# Patient Record
Sex: Female | Born: 1995 | Race: Black or African American | Hispanic: No | Smoking: Current some day smoker
Health system: Southern US, Community
[De-identification: ages and names within clinical notes are randomized; demographics above are authoritative.]

## PROBLEM LIST (undated history)

## (undated) DIAGNOSIS — R079 Chest pain, unspecified: Secondary | ICD-10-CM

## (undated) HISTORY — PX: NO PAST SURGERIES: SHX2092

## (undated) HISTORY — DX: Chest pain, unspecified: R07.9

---

## 2015-02-01 ENCOUNTER — Encounter (HOSPITAL_COMMUNITY): Payer: Self-pay | Admitting: Emergency Medicine

## 2015-02-01 ENCOUNTER — Emergency Department (HOSPITAL_COMMUNITY)
Admission: EM | Admit: 2015-02-01 | Discharge: 2015-02-01 | Disposition: A | Discharge status: Home / Self Care | Payer: Self-pay | Attending: Physician Assistant | Admitting: Physician Assistant

## 2015-02-01 DIAGNOSIS — K625 Hemorrhage of anus and rectum: Secondary | ICD-10-CM | POA: Insufficient documentation

## 2015-02-01 DIAGNOSIS — Z720 Tobacco use: Secondary | ICD-10-CM | POA: Insufficient documentation

## 2015-02-01 DIAGNOSIS — Z3202 Encounter for pregnancy test, result negative: Secondary | ICD-10-CM | POA: Insufficient documentation

## 2015-02-01 LAB — I-STAT CHEM 8, ED
BUN: 14 mg/dL (ref 6–20)
Calcium, Ion: 1.19 mmol/L (ref 1.12–1.23)
Chloride: 106 mmol/L (ref 101–111)
Creatinine, Ser: 0.8 mg/dL (ref 0.44–1.00)
Glucose, Bld: 96 mg/dL (ref 65–99)
HCT: 44 % (ref 36.0–46.0)
Hemoglobin: 15 g/dL (ref 12.0–15.0)
Potassium: 4 mmol/L (ref 3.5–5.1)
Sodium: 140 mmol/L (ref 135–145)
TCO2: 20 mmol/L (ref 0–100)

## 2015-02-01 LAB — POC OCCULT BLOOD, ED: Fecal Occult Bld: NEGATIVE

## 2015-02-01 LAB — POC URINE PREG, ED: Preg Test, Ur: NEGATIVE

## 2015-02-01 NOTE — Discharge Instructions (Signed)
Rectal Bleeding °Rectal bleeding is when blood passes out of the anus. It is usually a sign that something is wrong. It may not be serious, but it should always be evaluated. Rectal bleeding may present as bright red blood or extremely dark stools. The color may range from dark red or maroon to black (like tar). It is important that the cause of rectal bleeding be identified so treatment can be started and the problem corrected. °CAUSES  °· Hemorrhoids. These are enlarged (dilated) blood vessels or veins in the anal or rectal area. °· Fistulas. These are abnormal, burrowing channels that usually run from inside the rectum to the skin around the anus. They can bleed. °· Anal fissures. This is a tear in the tissue of the anus. Bleeding occurs with bowel movements. °· Diverticulosis. This is a condition in which pockets or sacs project from the bowel wall. Occasionally, the sacs can bleed. °· Diverticulitis. This is an infection involving diverticulosis of the colon. °· Proctitis and colitis. These are conditions in which the rectum, colon, or both, can become inflamed and pitted (ulcerated). °· Polyps and cancer. Polyps are non-cancerous (benign) growths in the colon that may bleed. Certain types of polyps turn into cancer. °· Protrusion of the rectum. Part of the rectum can project from the anus and bleed. °· Certain medicines. °· Intestinal infections. °· Blood vessel abnormalities. °HOME CARE INSTRUCTIONS °· Eat a high-fiber diet to keep your stool soft. °· Limit activity. °· Drink enough fluids to keep your urine clear or pale yellow. °· Warm baths may be useful to soothe rectal pain. °· Follow up with your caregiver as directed. °SEEK IMMEDIATE MEDICAL CARE IF: °· You develop increased bleeding. °· You have black or dark red stools. °· You vomit blood or material that looks like coffee grounds. °· You have abdominal pain or tenderness. °· You have a fever. °· You feel weak, nauseous, or you faint. °· You have  severe rectal pain or you are unable to have a bowel movement. °MAKE SURE YOU: °· Understand these instructions. °· Will watch your condition. °· Will get help right away if you are not doing well or get worse. °Document Released: 12/06/2001 Document Revised: 09/08/2011 Document Reviewed: 12/01/2010 °[employer]® Patient Information ©2015 [employer]. This information is not intended to replace advice given to you by your health care provider. Make sure you discuss any questions you have with your health care provider. ° °

## 2015-02-01 NOTE — ED Provider Notes (Signed)
CSN: [id number]     Arrival date & time 02/01/15  5284 History   First MD Initiated Contact with Patient 02/01/15 0757     Chief Complaint  Patient presents with  . GI Bleeding     (Consider location/radiation/quality/duration/timing/severity/associated sxs/prior Treatment) HPI  This is a 19 year old female who presents emergency Department with chief complaint of rectal bleeding. Patient states that this morning she is a rest room and saw that her stool was coated with bright red blood. She denies passing any clots. She estimates about a tablespoon of blood. She denies eating any red foods, she denies any vaginal bleeding or urinary bleeding. She experienced some mild abdominal cramping, which started about 15 minutes after passing the stool. She denies a history of a inflammatory bowel disease in her family. She denies a history of hemorrhoids. She denies any recent vigorous activity or running. She denies any rectal intercourse. She has no rectal pain at this time. She denies a history of constipation or laxative use. History reviewed. No pertinent past medical history. History reviewed. No pertinent past surgical history. No family history on file. History  Substance Use Topics  . Smoking status: Current Every Day Smoker    Types: Cigars  . Smokeless tobacco: Not on file  . Alcohol Use: 0.6 oz/week    1 Cans of beer per week   OB History    No data available     Review of Systems  Ten systems reviewed and are negative for acute change, except as noted in the HPI.    Allergies  Review of patient's allergies indicates no known allergies.  Home Medications   Prior to Admission medications   Medication Sig Start Date End Date Taking? Authorizing Provider  ibuprofen (ADVIL,MOTRIN) 200 MG tablet Take 200 mg by mouth every 6 (six) hours as needed.   Yes Historical Provider, MD   BP 124/69 mmHg  Pulse 68  Temp(Src) 98.4 F (36.9 C) (Oral)  Resp 18  SpO2 100%  LMP 01/04/2015  (Approximate) Physical Exam Physical Exam  Nursing note and vitals reviewed. Constitutional: She is oriented to person, place, and time. She appears well-developed and well-nourished. No distress.  HENT:  Head: Normocephalic and atraumatic.  Eyes: Conjunctivae normal and EOM are normal. Pupils are equal, round, and reactive to light. No scleral icterus.  Neck: Normal range of motion.  Cardiovascular: Normal rate, regular rhythm and normal heart sounds.  Exam reveals no gallop and no friction rub.   No murmur heard. Pulmonary/Chest: Effort normal and breath sounds normal. No respiratory distress.  Abdominal: Soft. Bowel sounds are normal. She exhibits no distension and no mass. There is no tenderness. There is no guarding.  Neurological: She is alert and oriented to person, place, and time.  Skin: Skin is warm and dry. She is not diaphoretic.  GU: Digital Rectal Exam reveals sphincter with good tone. No external hemorrhoids. No masses or fissures. Stool color is brown with no overt blood.   ED Course  Procedures (including critical care time) Labs Review Labs Reviewed  I-STAT CHEM 8, ED  POC URINE PREG, ED  POC OCCULT BLOOD, ED    Imaging Review No results found.   EKG Interpretation None      MDM   Final diagnoses:  None    negative. Point of care stool test, no changes in hemoglobin. Negative pregnancy. Patient is to be discharged. Follow up with her primary care physician or gastroenterology. She appears safe for discharge at this time  Arthor Captain, PA-C 02/01/15 1009  Courteney Lyn Corlis Leak, MD 02/01/15 1440

## 2015-02-01 NOTE — ED Notes (Signed)
Pt reports that when she woke up this morning and went to have a bowel movement and noticed bright red blood in the toilet. Pt alert x4. NAD at this time.

## 2015-09-08 ENCOUNTER — Encounter (HOSPITAL_COMMUNITY): Payer: Self-pay

## 2015-09-08 ENCOUNTER — Emergency Department (HOSPITAL_COMMUNITY)
Admission: EM | Admit: 2015-09-08 | Discharge: 2015-09-08 | Disposition: A | Discharge status: Home / Self Care | Payer: Self-pay | Attending: Emergency Medicine | Admitting: Emergency Medicine

## 2015-09-08 ENCOUNTER — Emergency Department (HOSPITAL_COMMUNITY): Payer: Self-pay

## 2015-09-08 DIAGNOSIS — R059 Cough, unspecified: Secondary | ICD-10-CM

## 2015-09-08 DIAGNOSIS — J040 Acute laryngitis: Secondary | ICD-10-CM | POA: Insufficient documentation

## 2015-09-08 DIAGNOSIS — R05 Cough: Secondary | ICD-10-CM

## 2015-09-08 DIAGNOSIS — R0602 Shortness of breath: Secondary | ICD-10-CM | POA: Insufficient documentation

## 2015-09-08 DIAGNOSIS — F17210 Nicotine dependence, cigarettes, uncomplicated: Secondary | ICD-10-CM | POA: Insufficient documentation

## 2015-09-08 MED ORDER — ALBUTEROL SULFATE HFA 108 (90 BASE) MCG/ACT IN AERS: 1.0000 | INHALATION_SPRAY | RESPIRATORY_TRACT | Status: DC | PRN

## 2015-09-08 MED ORDER — GUAIFENESIN-CODEINE 100-10 MG/5ML PO SOLN: 10.0000 mL | Freq: Three times a day (TID) | ORAL | Status: DC | PRN

## 2015-09-08 MED ORDER — GUAIFENESIN-CODEINE 100-10 MG/5ML PO SOLN
10.0000 mL | Freq: Once | ORAL | Status: AC
Start: 2015-09-08 — End: 2015-09-08
  Administered 2015-09-08: 10 mL via ORAL
  Filled 2015-09-08: qty 10

## 2015-09-08 NOTE — ED Provider Notes (Signed)
CSN: [id number]     Arrival date & time 09/08/15  0543 History   First MD Initiated Contact with Patient 09/08/15 0703     Chief Complaint  Patient presents with  . Hoarse  . Cough   (Consider location/radiation/quality/duration/timing/severity/associated sxs/prior Treatment) Patient is a 20 y.o. female presenting with cough. The history is provided by the patient. No language interpreter was used.  Cough Associated symptoms: shortness of breath   Associated symptoms: no chest pain, no chills and no fever     Karen Benitez is a 20 year old female with no significant past medical history who presents with blood streaked white sputum cough 1 week with hoarseness. She states that she was sick with flulike symptoms 1 week ago but all her symptoms resolved except for the cough. She has taken NyQuil, Chloraseptic throat spray, and drank hot tea to help relieve her symptoms. She states she feels short of breath at times.  She denies any fever, or throat, headache, rhinorrhea, chest pain, abdominal pain, nausea, vomiting.  History reviewed. No pertinent past medical history. History reviewed. No pertinent past surgical history. History reviewed. No pertinent family history. Social History  Substance Use Topics  . Smoking status: Current Every Day Smoker    Types: Cigars  . Smokeless tobacco: None  . Alcohol Use: 0.6 oz/week    1 Cans of beer per week   OB History    No data available     Review of Systems  Constitutional: Negative for fever and chills.  Respiratory: Positive for cough and shortness of breath.   Cardiovascular: Negative for chest pain.  All other systems reviewed and are negative.     Allergies  Review of patient's allergies indicates no known allergies.  Home Medications   Prior to Admission medications   Medication Sig Start Date End Date Taking? Authorizing Provider  albuterol (PROVENTIL HFA;VENTOLIN HFA) 108 (90 Base) MCG/ACT inhaler Inhale 1-2 puffs into  the lungs every 4 (four) hours as needed for wheezing or shortness of breath. 09/08/15   Pasco Marchitto Patel-Mills, PA-C  guaiFENesin-codeine 100-10 MG/5ML syrup Take 10 mLs by mouth 3 (three) times daily as needed for cough. 09/08/15   Colby Catanese Patel-Mills, PA-C   BP 122/77 mmHg  Pulse 56  Temp(Src) 98.8 F (37.1 C) (Oral)  Resp 18  Ht 5\' 4"  (1.626 m)  Wt 81.647 kg  BMI 30.88 kg/m2  SpO2 100%  LMP 09/08/2015 Physical Exam  Constitutional: She is oriented to person, place, and time. She appears well-developed and well-nourished. No distress.  HENT:  Head: Normocephalic and atraumatic.  Mouth/Throat: Oropharynx is clear and moist. No oropharyngeal exudate.  Oropharynx is clear and moist. Uvula midline. No drooling or trismus.  Eyes: Conjunctivae are normal.  Neck: Normal range of motion. Neck supple.  Cardiovascular: Normal rate, regular rhythm and normal heart sounds.   Pulmonary/Chest: Effort normal and breath sounds normal. No respiratory distress. She has no wheezes. She has no rales.  Lungs clear to auscultation bilaterally. No wheezing or decreased breath sounds. Sitting comfortably in no acute distress.  Hoarse voice.  Abdominal: Soft. There is no tenderness.  Musculoskeletal: Normal range of motion.  Neurological: She is alert and oriented to person, place, and time.  Skin: Skin is warm. She is not diaphoretic.  Nursing note and vitals reviewed.   ED Course  Procedures (including critical care time) Labs Review Labs Reviewed - No data to display  Imaging Review Dg Chest 2 View  09/08/2015  CLINICAL DATA:  Cough and  fever EXAM: CHEST  2 VIEW COMPARISON:  None. FINDINGS: Normal heart size and mediastinal contours. No acute infiltrate or edema. No effusion or pneumothorax. No acute osseous findings. IMPRESSION: No active cardiopulmonary disease. Electronically Signed   By: Marnee Spring M.D.   On: 09/08/2015 06:52   I have personally reviewed and evaluated these image results as  part of my medical decision-making.   EKG Interpretation None      MDM   Final diagnoses:  Cough  Laryngitis   Patient presents for productive blood streaked, white sputum cough 1 week. This is residual from flulike symptoms 1 week ago. Chest x-ray is negative for infiltrate or edema. No pneumothorax. Patient is hoarse but otherwise her exam is normal. I discussed return precautions with the patient as well as follow-up. Patient agrees with plan. Filed Vitals:   09/08/15 0551 09/08/15 0805  BP: 132/75 122/77  Pulse: 65 56  Temp: 98.4 F (36.9 C) 98.8 F (37.1 C)  Resp: 20 18   Medications  guaiFENesin-codeine 100-10 MG/5ML solution 10 mL (10 mLs Oral Given 09/08/15 0812)      Catha Gosselin, PA-C 09/08/15 1524  Marily Memos, MD 09/09/15 1610

## 2015-09-08 NOTE — Discharge Instructions (Signed)

## 2015-09-08 NOTE — ED Notes (Signed)
Pt here for cough all week, hoarseness and sts today had episode of blood tinged sputum.

## 2015-09-08 NOTE — ED Notes (Signed)
Patient transported to X-ray 

## 2016-10-25 ENCOUNTER — Emergency Department (HOSPITAL_COMMUNITY): Payer: Self-pay

## 2016-10-25 ENCOUNTER — Encounter (HOSPITAL_COMMUNITY): Payer: Self-pay

## 2016-10-25 ENCOUNTER — Emergency Department (HOSPITAL_COMMUNITY)
Admission: EM | Admit: 2016-10-25 | Discharge: 2016-10-26 | Disposition: A | Discharge status: Home / Self Care | Payer: Self-pay | Attending: Emergency Medicine | Admitting: Emergency Medicine

## 2016-10-25 DIAGNOSIS — R079 Chest pain, unspecified: Secondary | ICD-10-CM | POA: Insufficient documentation

## 2016-10-25 DIAGNOSIS — F17290 Nicotine dependence, other tobacco product, uncomplicated: Secondary | ICD-10-CM | POA: Insufficient documentation

## 2016-10-25 LAB — CBC
HCT: 36.7 % (ref 36.0–46.0)
Hemoglobin: 12.5 g/dL (ref 12.0–15.0)
MCH: 28.9 pg (ref 26.0–34.0)
MCHC: 34.1 g/dL (ref 30.0–36.0)
MCV: 84.8 fL (ref 78.0–100.0)
PLATELETS: 318 10*3/uL (ref 150–400)
RBC: 4.33 MIL/uL (ref 3.87–5.11)
RDW: 14 % (ref 11.5–15.5)
WBC: 8.6 10*3/uL (ref 4.0–10.5)

## 2016-10-25 LAB — BASIC METABOLIC PANEL
Anion gap: 8 (ref 5–15)
BUN: 10 mg/dL (ref 6–20)
CHLORIDE: 104 mmol/L (ref 101–111)
CO2: 24 mmol/L (ref 22–32)
CREATININE: 0.9 mg/dL (ref 0.44–1.00)
Calcium: 9.4 mg/dL (ref 8.9–10.3)
GFR calc Af Amer: >60 mL/min (ref >60)
GFR calc non Af Amer: >60 mL/min (ref >60)
Glucose, Bld: 97 mg/dL (ref 65–99)
Potassium: 3.6 mmol/L (ref 3.5–5.1)
SODIUM: 136 mmol/L (ref 135–145)

## 2016-10-25 LAB — I-STAT BETA HCG BLOOD, ED (MC, WL, AP ONLY): I-stat hCG, quantitative: <5 m[IU]/mL (ref <5)

## 2016-10-25 LAB — D-DIMER, QUANTITATIVE: D-Dimer, Quant: 0.5 ug/mL-FEU (ref 0.00–0.50)

## 2016-10-25 LAB — TROPONIN I

## 2016-10-25 MED ORDER — HYDROCODONE-ACETAMINOPHEN 5-325 MG PO TABS
1.0000 | ORAL_TABLET | Freq: Once | ORAL | Status: AC
  Administered 2016-10-25: 1 via ORAL
  Filled 2016-10-25: qty 1

## 2016-10-25 NOTE — ED Notes (Signed)
Patient transported to X-ray 

## 2016-10-25 NOTE — ED Triage Notes (Signed)
Pt states she had sudden onset of right side sharp chest pain with SOB allday; pt states she has taken antiacids with no relief;t pt a&ox 4 on arrival; no obvious signs of distress noted

## 2016-10-25 NOTE — ED Provider Notes (Signed)
[facility]-EMERGENCY DEPT Provider Note   CSN: [id number] Arrival date & time: 10/25/16  2142     History   Chief Complaint Chief Complaint  Patient presents with  . Chest Pain  . Shortness of Breath    HPI Karen Benitez is a 21 y.o. female.  HPI   Patient presents with sharp right sided chest pain that radiates through to the back, worse with deep inspiration and movement, swallowing, and breathing, has been constant all day, not improved with ibuprofen.  Notes she has had similar pain about every 6 months, usually only lasts a few hours.  She smoked black and milds.  Denies fevers, chills, myalgias, night sweats, recent illness, cough, hemoptysis, sick contacts.  She is on nexplanon.  No recent immobilization or leg swelling.  Denies associated rash.    History reviewed. No pertinent past medical history.  There are no active problems to display for this patient.   History reviewed. No pertinent surgical history.  OB History    No data available       Home Medications    Prior to Admission medications   Medication Sig Start Date End Date Taking? Authorizing Provider  albuterol (PROVENTIL HFA;VENTOLIN HFA) 108 (90 Base) MCG/ACT inhaler Inhale 1-2 puffs into the lungs every 4 (four) hours as needed for wheezing or shortness of breath. 09/08/15   Hanna Patel-Mills, PA-C  guaiFENesin-codeine 100-10 MG/5ML syrup Take 10 mLs by mouth 3 (three) times daily as needed for cough. 09/08/15   Catha Gosselin, PA-C    Family History No family history on file.  Social History Social History  Substance Use Topics  . Smoking status: Current Every Day Smoker    Types: Cigars  . Smokeless tobacco: Not on file  . Alcohol use 0.6 oz/week    1 Cans of beer per week     Allergies   Patient has no known allergies.   Review of Systems Review of Systems  All other systems reviewed and are negative.    Physical Exam Updated Vital Signs BP 125/74   Pulse 70   Temp 98.7 F  (37.1 C) (Oral)   Resp 20   LMP 09/30/2016   SpO2 100%   Physical Exam  Constitutional: She appears well-developed and well-nourished. No distress.  HENT:  Head: Normocephalic and atraumatic.  Neck: Neck supple.  Cardiovascular: Normal rate, regular rhythm and intact distal pulses.   Pulmonary/Chest: Effort normal and breath sounds normal. No respiratory distress. She has no wheezes. She has no rales.  Abdominal: Soft. She exhibits no distension. There is no tenderness. There is no rebound and no guarding.  Musculoskeletal: She exhibits no edema.  Neurological: She is alert.  Skin: She is not diaphoretic.  Nursing note and vitals reviewed.    ED Treatments / Results  Labs (all labs ordered are listed, but only abnormal results are displayed) Labs Reviewed  BASIC METABOLIC PANEL  CBC  TROPONIN I  D-DIMER, QUANTITATIVE (NOT AT [facility])  I-STAT BETA HCG BLOOD, ED ([facility], [facility], [facility] ONLY)    EKG  EKG Interpretation  Date/Time:  Saturday October 25 2016 21:48:44 EDT Ventricular Rate:  82 PR Interval:  138 QRS Duration: 78 QT Interval:  368 QTC Calculation: 429 R Axis:   79 Text Interpretation:  Normal sinus rhythm with sinus arrhythmia Nonspecific ST and T wave abnormality No previous tracing Confirmed by Anitra Lauth  MD, Alphonzo Lemmings ([id number]) on 10/25/2016 11:55:12 PM       Radiology Dg Chest 2 View  Result  Date: 10/25/2016 CLINICAL DATA:  Chest pain and shortness of breath EXAM: CHEST  2 VIEW COMPARISON:  September 08, 2015 FINDINGS: The heart size and mediastinal contours are within normal limits. Both lungs are clear. The visualized skeletal structures are unremarkable. IMPRESSION: No active cardiopulmonary disease. Electronically Signed   By: Gerome Sam III M.D   On: 10/25/2016 22:38   Ct Angio Chest Pe W/cm &/or Wo Cm  Result Date: 10/26/2016 CLINICAL DATA:  Chest pain x6 months intermittently, worsening this evening with pleuritic chest pain on the right. EXAM: CT ANGIOGRAPHY CHEST  WITH CONTRAST TECHNIQUE: Multidetector CT imaging of the chest was performed using the standard protocol during bolus administration of intravenous contrast. Multiplanar CT image reconstructions and MIPs were obtained to evaluate the vascular anatomy. CONTRAST:  100 cc Isovue 370 IV COMPARISON:  None. FINDINGS: Cardiovascular: The study is of quality for the evaluation of pulmonary embolism. There are no filling defects in the central, lobar, segmental or subsegmental pulmonary artery branches to suggest acute pulmonary embolism. Great vessels are normal in course and caliber. Normal heart size. No significant pericardial fluid/thickening. Mediastinum/Nodes: No discrete thyroid nodules. Unremarkable esophagus. No pathologically enlarged axillary, mediastinal or hilar lymph nodes. Lungs/Pleura: No pneumothorax. No pleural effusion. Minimal bibasilar dependent atelectasis. Upper abdomen: Unremarkable. Musculoskeletal:  No aggressive appearing focal osseous lesions. Review of the MIP images confirms the above findings. IMPRESSION: Negative for pulmonary embolus. Minimal bibasilar dependent atelectasis. No acute pulmonary disease. Electronically Signed   By: Tollie Eth M.D.   On: 10/26/2016 01:47    Procedures Procedures (including critical care time)  Medications Ordered in ED Medications  HYDROcodone-acetaminophen (NORCO/VICODIN) 5-325 MG per tablet 1 tablet (1 tablet Oral Given 10/25/16 2348)  iopamidol (ISOVUE-370) 76 % injection (100 mLs  Contrast Given 10/26/16 0052)     Initial Impression / Assessment and Plan / ED Course  I have reviewed the triage vital signs and the nursing notes.  Pertinent labs & imaging results that were available during my care of the patient were reviewed by me and considered in my medical decision making (see chart for details).     Afebrile, nontoxic patient with right sided sharp chest pain with pleuritic component.  Workup reassuring.  D-dimer was borderline and pt  had concerning clinical scenario - engaged in joint medical decision making with the patient and CT angio chest was ordered; this was also negative.   D/C home with PCP follow up.  Discussed result, findings, treatment, and follow up  with patient.  Pt given return precautions.  Pt verbalizes understanding and agrees with plan.       Final Clinical Impressions(s) / ED Diagnoses   Final diagnoses:  Right-sided chest pain    New Prescriptions Discharge Medication List as of 10/26/2016  1:54 AM       Trixie Dredge, PA-C 10/26/16 1610    Gwyneth Sprout, MD 10/26/16 2240

## 2016-10-25 NOTE — ED Notes (Signed)
ED Provider at bedside. 

## 2016-10-26 ENCOUNTER — Emergency Department (HOSPITAL_COMMUNITY): Payer: Self-pay

## 2016-10-26 ENCOUNTER — Encounter (HOSPITAL_COMMUNITY): Payer: Self-pay | Admitting: Radiology

## 2016-10-26 MED ORDER — IOPAMIDOL (ISOVUE-370) INJECTION 76%
INTRAVENOUS | Status: AC
  Administered 2016-10-26: 100 mL
  Filled 2016-10-26: qty 100

## 2016-10-26 NOTE — Discharge Instructions (Signed)
Read the information below.  You may return to the Emergency Department at any time for worsening condition or any new symptoms that concern you.   If you develop worsening chest pain, shortness of breath, fever, you pass out, or become weak or dizzy, return to the ER for a recheck.    °

## 2016-10-26 NOTE — ED Notes (Signed)
Patient transported to CT 

## 2017-02-04 ENCOUNTER — Other Ambulatory Visit (HOSPITAL_COMMUNITY)
Admission: RE | Admit: 2017-02-04 | Discharge: 2017-02-04 | Disposition: A | Discharge status: Home / Self Care | Payer: 59 | Source: Ambulatory Visit | Attending: Obstetrics and Gynecology | Admitting: Obstetrics and Gynecology

## 2017-02-04 ENCOUNTER — Other Ambulatory Visit: Payer: Self-pay | Admitting: Obstetrics and Gynecology

## 2017-02-04 DIAGNOSIS — Z01419 Encounter for gynecological examination (general) (routine) without abnormal findings: Secondary | ICD-10-CM | POA: Diagnosis present

## 2017-02-05 LAB — CYTOLOGY - PAP: Diagnosis: NEGATIVE

## 2017-11-09 ENCOUNTER — Encounter (HOSPITAL_COMMUNITY): Payer: Self-pay | Admitting: Emergency Medicine

## 2017-11-09 ENCOUNTER — Emergency Department (HOSPITAL_COMMUNITY): Payer: Self-pay

## 2017-11-09 ENCOUNTER — Emergency Department (HOSPITAL_COMMUNITY)
Admission: EM | Admit: 2017-11-09 | Discharge: 2017-11-09 | Disposition: A | Discharge status: Home / Self Care | Payer: Self-pay | Attending: Emergency Medicine | Admitting: Emergency Medicine

## 2017-11-09 DIAGNOSIS — R0789 Other chest pain: Secondary | ICD-10-CM | POA: Insufficient documentation

## 2017-11-09 DIAGNOSIS — M940 Chondrocostal junction syndrome [Tietze]: Secondary | ICD-10-CM | POA: Insufficient documentation

## 2017-11-09 LAB — BASIC METABOLIC PANEL
Anion gap: 9 (ref 5–15)
BUN: 13 mg/dL (ref 6–20)
CO2: 24 mmol/L (ref 22–32)
Calcium: 9.3 mg/dL (ref 8.9–10.3)
Chloride: 107 mmol/L (ref 101–111)
Creatinine, Ser: 0.85 mg/dL (ref 0.44–1.00)
GFR calc Af Amer: >60 mL/min (ref >60)
GFR calc non Af Amer: >60 mL/min (ref >60)
GLUCOSE: 89 mg/dL (ref 65–99)
POTASSIUM: 4.1 mmol/L (ref 3.5–5.1)
Sodium: 140 mmol/L (ref 135–145)

## 2017-11-09 LAB — I-STAT TROPONIN, ED
Troponin i, poc: 0 ng/mL (ref 0.00–0.08)
Troponin i, poc: 0 ng/mL (ref 0.00–0.08)

## 2017-11-09 LAB — I-STAT BETA HCG BLOOD, ED (MC, WL, AP ONLY): I-stat hCG, quantitative: <5 m[IU]/mL (ref <5)

## 2017-11-09 LAB — CBC
HEMATOCRIT: 40.1 % (ref 36.0–46.0)
Hemoglobin: 13.8 g/dL (ref 12.0–15.0)
MCH: 29.6 pg (ref 26.0–34.0)
MCHC: 34.4 g/dL (ref 30.0–36.0)
MCV: 85.9 fL (ref 78.0–100.0)
Platelets: 317 10*3/uL (ref 150–400)
RBC: 4.67 MIL/uL (ref 3.87–5.11)
RDW: 13.5 % (ref 11.5–15.5)
WBC: 5.6 10*3/uL (ref 4.0–10.5)

## 2017-11-09 LAB — D-DIMER, QUANTITATIVE (NOT AT ARMC): D-Dimer, Quant: 0.38 ug/mL-FEU (ref 0.00–0.50)

## 2017-11-09 MED ORDER — PREDNISONE 50 MG PO TABS: 50.0000 mg | ORAL_TABLET | Freq: Every day | ORAL | 0 refills | Status: DC

## 2017-11-09 MED ORDER — TRAMADOL HCL 50 MG PO TABS: 50.0000 mg | ORAL_TABLET | Freq: Four times a day (QID) | ORAL | 0 refills | Status: DC | PRN

## 2017-11-09 MED ORDER — KETOROLAC TROMETHAMINE 30 MG/ML IJ SOLN: 30.0000 mg | Freq: Once | INTRAMUSCULAR | Status: DC

## 2017-11-09 MED ORDER — KETOROLAC TROMETHAMINE 60 MG/2ML IM SOLN: 60.0000 mg | Freq: Once | INTRAMUSCULAR | Status: DC

## 2017-11-09 NOTE — ED Notes (Signed)
Spoke with Harrold Donath from the lab. Will add D-dimer to an existing tube at the lab.

## 2017-11-09 NOTE — ED Triage Notes (Signed)
Pt reports for past month having intermittent central chest pains and some today on right side. Pains come on when bent over and moving around is when pains come on. denies n/v/d. But has been burping a lot, even if hasnt eaten.

## 2017-11-09 NOTE — ED Notes (Signed)
3 hour troponin order placed per verbal. 

## 2017-11-09 NOTE — ED Notes (Signed)
ED Provider at bedside. 

## 2017-11-09 NOTE — ED Provider Notes (Signed)
[facility]-EMERGENCY DEPT Provider Note   CSN: [id number] Arrival date & time: 11/09/17  1223     History   Chief Complaint Chief Complaint  Patient presents with  . Chest Pain    HPI Karen Benitez is a 22 y.o. female.  HPI Patient presents to the emergency department with 2 months worth of chest discomfort.  Patient states is not constant and seems to come and go.  She states certain movements will sometimes make it worse.  The patient states that it feels like sharp pain that does sometimes radiate to the right shoulder.  Patient states that she has not had any other symptoms associated with this.  The patient denies  shortness of breath, headache,blurred vision, neck pain, fever, cough, weakness, numbness, dizziness, anorexia, edema, abdominal pain, nausea, vomiting, diarrhea, rash, back pain, dysuria, hematemesis, bloody stool, near syncope, or syncope. History reviewed. No pertinent past medical history.  There are no active problems to display for this patient.   History reviewed. No pertinent surgical history.   OB History   None      Home Medications    Prior to Admission medications   Medication Sig Start Date End Date Taking? Authorizing Provider  albuterol (PROVENTIL HFA;VENTOLIN HFA) 108 (90 Base) MCG/ACT inhaler Inhale 1-2 puffs into the lungs every 4 (four) hours as needed for wheezing or shortness of breath. Patient not taking: Reported on 11/09/2017 09/08/15   Patel-Mills, Lorelle Formosa, PA-C  guaiFENesin-codeine 100-10 MG/5ML syrup Take 10 mLs by mouth 3 (three) times daily as needed for cough. Patient not taking: Reported on 11/09/2017 09/08/15   Catha Gosselin, PA-C    Family History No family history on file.  Social History Social History   Tobacco Use  . Smoking status: Former Smoker    Types: Cigars  . Smokeless tobacco: Never Used  Substance Use Topics  . Alcohol use: Yes    Alcohol/week: 0.6 oz    Types: 1 Cans of beer per  week  . Drug use: No     Allergies   Patient has no known allergies.   Review of Systems Review of Systems  All other systems negative except as documented in the HPI. All pertinent positives and negatives as reviewed in the HPI.  Physical Exam Updated Vital Signs BP (!) 116/92 (BP Location: Left Arm)   Pulse 88   Temp 98.9 F (37.2 C) (Oral)   Resp 18   Ht  (1.651 m)   Wt 81.6 kg (180 lb)   SpO2 100%   BMI 29.95 kg/m   Physical Exam  Constitutional: She is oriented to person, place, and time. She appears well-developed and well-nourished. No distress.  HENT:  Head: Normocephalic and atraumatic.  Mouth/Throat: Oropharynx is clear and moist.  Eyes: Pupils are equal, round, and reactive to light.  Neck: Normal range of motion. Neck supple.  Cardiovascular: Normal rate, regular rhythm and normal heart sounds. Exam reveals no gallop and no friction rub.  No murmur heard. Pulmonary/Chest: Effort normal and breath sounds normal. No respiratory distress. She has no decreased breath sounds. She has no wheezes. She has no rhonchi. She has no rales.  Abdominal: Soft. Bowel sounds are normal. She exhibits no distension. There is no tenderness.  Neurological: She is alert and oriented to person, place, and time. She exhibits normal muscle tone. Coordination normal.  Skin: Skin is warm and dry. Capillary refill takes less than 2 seconds. No rash noted. No erythema.  Psychiatric: She has  a normal mood and affect. Her behavior is normal.  Nursing note and vitals reviewed.    ED Treatments / Results  Labs (all labs ordered are listed, but only abnormal results are displayed) Labs Reviewed  BASIC METABOLIC PANEL  CBC  D-DIMER, QUANTITATIVE (NOT AT [facility])  I-STAT TROPONIN, ED  I-STAT BETA HCG BLOOD, ED ([facility], [facility], [facility] ONLY)  I-STAT TROPONIN, ED    EKG None  Radiology Dg Chest 2 View  Result Date: 11/09/2017 CLINICAL DATA:  Intermittent mid chest discomfort for the past  month with right-sided chest pain today. Symptoms are precipitated by bending over and moving. Increased burping. No other GI symptoms. EXAM: CHEST - 2 VIEW COMPARISON:  PA and lateral chest x-ray of October 25, 2016 and chest CT scan of October 26, 2016 FINDINGS: The lungs are adequately inflated and clear. The heart and pulmonary vascularity are normal. The mediastinum is normal in width. There is no pleural effusion, pneumothorax, or pneumomediastinum. The bony thorax is unremarkable. IMPRESSION: There is no active cardiopulmonary disease. Electronically Signed   By: David  [place] M.D.   On: 11/09/2017 13:19    Procedures Procedures (including critical care time)  Medications Ordered in ED Medications  ketorolac (TORADOL) 30 MG/ML injection 30 mg (has no administration in time range)     Initial Impression / Assessment and Plan / ED Course  I have reviewed the triage vital signs and the nursing notes.  Pertinent labs & imaging results that were available during my care of the patient were reviewed by me and considered in my medical decision making (see chart for details).     Patient will need further evaluation in the primary care setting.  At this point I do not see any significant findings on her imaging or laboratory testing.  Patient has a negative d-dimer.  I advised the patient to return here for any worsening in her condition.  Patient agrees the plan and all questions were answered.  The patient's atypical chest pain most likely is chest wall versus costochondritis.   Final Clinical Impressions(s) / ED Diagnoses   Final diagnoses:  None    ED Discharge Orders    None       Charlestine Night, PA-C 11/10/17 0024    Charlynne Pander, MD 11/11/17 [phone]

## 2017-11-09 NOTE — Discharge Instructions (Signed)
Return here as needed.  Follow-up with the clinic provided.  Your x-rays do not show any abnormalities in your blood work for your heart and for possible blood clot are normal.

## 2018-12-10 ENCOUNTER — Emergency Department (HOSPITAL_COMMUNITY)
Admission: EM | Admit: 2018-12-10 | Discharge: 2018-12-10 | Disposition: A | Discharge status: Home / Self Care | Payer: 59 | Attending: Emergency Medicine | Admitting: Emergency Medicine

## 2018-12-10 ENCOUNTER — Other Ambulatory Visit: Payer: Self-pay

## 2018-12-10 ENCOUNTER — Encounter (HOSPITAL_COMMUNITY): Payer: Self-pay

## 2018-12-10 DIAGNOSIS — Z23 Encounter for immunization: Secondary | ICD-10-CM | POA: Diagnosis not present

## 2018-12-10 DIAGNOSIS — W2203XA Walked into furniture, initial encounter: Secondary | ICD-10-CM | POA: Insufficient documentation

## 2018-12-10 DIAGNOSIS — Y999 Unspecified external cause status: Secondary | ICD-10-CM | POA: Diagnosis not present

## 2018-12-10 DIAGNOSIS — Z79899 Other long term (current) drug therapy: Secondary | ICD-10-CM | POA: Diagnosis not present

## 2018-12-10 DIAGNOSIS — S81812A Laceration without foreign body, left lower leg, initial encounter: Secondary | ICD-10-CM | POA: Diagnosis present

## 2018-12-10 DIAGNOSIS — Y929 Unspecified place or not applicable: Secondary | ICD-10-CM | POA: Insufficient documentation

## 2018-12-10 DIAGNOSIS — Y939 Activity, unspecified: Secondary | ICD-10-CM | POA: Diagnosis not present

## 2018-12-10 DIAGNOSIS — Z87891 Personal history of nicotine dependence: Secondary | ICD-10-CM | POA: Diagnosis not present

## 2018-12-10 MED ORDER — TETANUS-DIPHTH-ACELL PERTUSSIS 5-2.5-18.5 LF-MCG/0.5 IM SUSP
0.5000 mL | Freq: Once | INTRAMUSCULAR | Status: AC
  Administered 2018-12-10: 0.5 mL via INTRAMUSCULAR
  Filled 2018-12-10: qty 0.5

## 2018-12-10 MED ORDER — LIDOCAINE-EPINEPHRINE (PF) 2 %-1:200000 IJ SOLN
10.0000 mL | Freq: Once | INTRAMUSCULAR | Status: AC
  Administered 2018-12-10: 10 mL
  Filled 2018-12-10: qty 10

## 2018-12-10 NOTE — ED Provider Notes (Signed)
[facility] DEPT Provider Note   CSN: [id number] Arrival date & time: 12/10/18  2015     History   Chief Complaint Chief Complaint  Patient presents with  . Laceration    HPI Karen Benitez is a 23 y.o. female.  HPI   23 year old female with laceration to her left shin.  She cut it on the edge of a coffee table earlier today.  Denies any other injuries.  No numbness or tingling.  She is unsure of her last tetanus shot.  History reviewed. No pertinent past medical history.  There are no active problems to display for this patient.   History reviewed. No pertinent surgical history.   OB History   No obstetric history on file.      Home Medications    Prior to Admission medications   Medication Sig Start Date End Date Taking? Authorizing Provider  albuterol (PROVENTIL HFA;VENTOLIN HFA) 108 (90 Base) MCG/ACT inhaler Inhale 1-2 puffs into the lungs every 4 (four) hours as needed for wheezing or shortness of breath. Patient not taking: Reported on 11/09/2017 09/08/15   Patel-Mills, Orvil Feil, PA-C  guaiFENesin-codeine 100-10 MG/5ML syrup Take 10 mLs by mouth 3 (three) times daily as needed for cough. Patient not taking: Reported on 11/09/2017 09/08/15   Patel-Mills, Orvil Feil, PA-C  predniSONE (DELTASONE) 50 MG tablet Take 1 tablet (50 mg total) by mouth daily. 11/09/17   Lawyer, Harrell Gave, PA-C  traMADol (ULTRAM) 50 MG tablet Take 1 tablet (50 mg total) by mouth every 6 (six) hours as needed for severe pain. 11/09/17   Dalia Heading, PA-C    Family History History reviewed. No pertinent family history.  Social History Social History   Tobacco Use  . Smoking status: Former Smoker    Types: Cigars  . Smokeless tobacco: Never Used  Substance Use Topics  . Alcohol use: Yes    Alcohol/week: 1.0 standard drinks    Types: 1 Cans of beer per week  . Drug use: No     Allergies   Patient has no known allergies.   Review of Systems Review of  Systems  All systems reviewed and negative, other than as noted in HPI.  Physical Exam Updated Vital Signs BP (!) 157/95 (BP Location: Left Arm)   Pulse 87   Temp 98.2 F (36.8 C) (Oral)   Resp 16   SpO2 100%   Physical Exam Vitals signs and nursing note reviewed.  Constitutional:      General: She is not in acute distress.    Appearance: She is well-developed.  HENT:     Head: Normocephalic and atraumatic.  Eyes:     General:        Right eye: No discharge.        Left eye: No discharge.     Conjunctiva/sclera: Conjunctivae normal.  Neck:     Musculoskeletal: Neck supple.  Cardiovascular:     Rate and Rhythm: Normal rate and regular rhythm.     Heart sounds: Normal heart sounds. No murmur. No friction rub. No gallop.   Pulmonary:     Effort: Pulmonary effort is normal. No respiratory distress.     Breath sounds: Normal breath sounds.  Abdominal:     General: There is no distension.     Palpations: Abdomen is soft.     Tenderness: There is no abdominal tenderness.  Musculoskeletal:     Comments: R proximal shin with approximately 4 cm linear laceration.  No active bleeding.  Skin:  General: Skin is warm and dry.  Neurological:     Mental Status: She is alert.  Psychiatric:        Behavior: Behavior normal.        Thought Content: Thought content normal.      ED Treatments / Results  Labs (all labs ordered are listed, but only abnormal results are displayed) Labs Reviewed - No data to display  EKG    Radiology No results found.  Procedures Procedures (including critical care time)  Medications Ordered in ED Medications  lidocaine-EPINEPHrine (XYLOCAINE W/EPI) 2 %-1:200000 (PF) injection 10 mL (has no administration in time range)  Tdap (BOOSTRIX) injection 0.5 mL (has no administration in time range)     Initial Impression / Assessment and Plan / ED Course  I have reviewed the triage vital signs and the nursing notes.  Pertinent labs &  imaging results that were available during my care of the patient were reviewed by me and considered in my medical decision making (see chart for details).        [age] year old female with left shin laceration.  Neurovascularly intact.  Will close.  Update tetanus.  Final Clinical Impressions(s) / ED Diagnoses   Final diagnoses:  Laceration of right lower extremity, initial encounter    ED Discharge Orders    None       Raeford RazorKohut, Rich Paprocki, MD 12/11/18 1504

## 2018-12-10 NOTE — ED Triage Notes (Signed)
Pt reports cutting her L shin on her coffee table today. Deep laceration noted. Bleeding controlled with dressing. A&Ox4. Unsure of last tetanus shot.

## 2018-12-10 NOTE — ED Provider Notes (Signed)
  Physical Exam  BP 128/89   Pulse 78   Temp 98.2 F (36.8 C) (Oral)   Resp 16   SpO2 99%   Physical Exam Vitals signs and nursing note reviewed.  Constitutional:      General: She is not in acute distress.    Appearance: Normal appearance. She is well-developed. She is not ill-appearing.  HENT:     Head: Normocephalic and atraumatic.  Eyes:     General: No scleral icterus.       Right eye: No discharge.        Left eye: No discharge.     Conjunctiva/sclera: Conjunctivae normal.     Pupils: Pupils are equal, round, and reactive to light.  Neck:     Musculoskeletal: Normal range of motion.  Cardiovascular:     Rate and Rhythm: Normal rate.  Pulmonary:     Effort: Pulmonary effort is normal. No respiratory distress.  Abdominal:     General: There is no distension.  Skin:    General: Skin is warm and dry.     Comments: ~4 cm linear laceration over the right anterior shin  Neurological:     Mental Status: She is alert and oriented to person, place, and time.  Psychiatric:        Behavior: Behavior normal.     ED Course/Procedures     .Marland KitchenLaceration Repair  Date/Time: 12/10/2018 10:46 PM Performed by: Recardo Evangelist, PA-C Authorized by: Recardo Evangelist, PA-C   Consent:    Consent obtained:  Verbal   Consent given by:  Patient   Risks discussed:  Infection and pain   Alternatives discussed:  No treatment Anesthesia (see MAR for exact dosages):    Anesthesia method:  Local infiltration   Local anesthetic:  Lidocaine 2% WITH epi Laceration details:    Location:  Leg   Leg location:  R lower leg   Length (cm):  4   Depth (mm):  10 Repair type:    Repair type:  Simple Pre-procedure details:    Preparation:  Patient was prepped and draped in usual sterile fashion Exploration:    Wound exploration: wound explored through full range of motion and entire depth of wound probed and visualized     Wound extent: no fascia violation noted, no muscle damage noted,  no tendon damage noted and no vascular damage noted   Treatment:    Area cleansed with:  Saline   Amount of cleaning:  Standard   Irrigation method:  Syringe   Visualized foreign bodies/material removed: no   Skin repair:    Repair method:  Sutures   Suture size:  3-0   Suture material:  Prolene   Number of sutures:  7 Approximation:    Approximation:  Close Post-procedure details:    Dressing:  Antibiotic ointment and sterile dressing   Patient tolerance of procedure:  Tolerated well, no immediate complications    MDM   23 year old female with linear laceration over the right anterior shin after cutting herself on a glass table. Wound was numbed, irrigated, and sutured with 7 3-0 Prolene. Pt was instructed on wound care and to get stitches out in 7-10 days.       Recardo Evangelist, PA-C 12/10/18 2248    Virgel Manifold, MD 12/11/18 [phone]

## 2018-12-10 NOTE — Discharge Instructions (Signed)
Sutures out in 10-14 days.

## 2020-09-04 ENCOUNTER — Encounter (HOSPITAL_COMMUNITY): Payer: Self-pay

## 2020-09-04 ENCOUNTER — Emergency Department (HOSPITAL_COMMUNITY)
Admission: EM | Admit: 2020-09-04 | Discharge: 2020-09-04 | Disposition: A | Discharge status: Home / Self Care | Payer: 59 | Attending: Emergency Medicine | Admitting: Emergency Medicine

## 2020-09-04 ENCOUNTER — Other Ambulatory Visit: Payer: Self-pay

## 2020-09-04 DIAGNOSIS — X58XXXA Exposure to other specified factors, initial encounter: Secondary | ICD-10-CM | POA: Diagnosis not present

## 2020-09-04 DIAGNOSIS — Z87891 Personal history of nicotine dependence: Secondary | ICD-10-CM | POA: Insufficient documentation

## 2020-09-04 DIAGNOSIS — T162XXA Foreign body in left ear, initial encounter: Secondary | ICD-10-CM | POA: Diagnosis present

## 2020-09-04 NOTE — ED Provider Notes (Signed)
[facility]-EMERGENCY DEPT Provider Note   CSN: [id number] Arrival date & time: 09/04/20  0330   History Chief Complaint  Patient presents with  . Foreign Body in Ear    Karen Benitez is a 25 y.o. female.  The history is provided by the patient.  Foreign Body in Ear  She complains of a bug that got into her left ear.  She could feel it moving.  It was not painful, but she was unable to flush it out of her ear by herself.  She denies any difficulty hearing.  An insect was removed from the ear at triage.  She no longer has a foreign body sensation in her ear.  History reviewed. No pertinent past medical history.  There are no problems to display for this patient.   History reviewed. No pertinent surgical history.   OB History   No obstetric history on file.     No family history on file.  Social History   Tobacco Use  . Smoking status: Former Smoker    Types: Cigars  . Smokeless tobacco: Never Used  Vaping Use  . Vaping Use: Never used  Substance Use Topics  . Alcohol use: Yes    Alcohol/week: 1.0 standard drink    Types: 1 Cans of beer per week  . Drug use: No    Home Medications Prior to Admission medications   Medication Sig Start Date End Date Taking? Authorizing Provider  albuterol (PROVENTIL HFA;VENTOLIN HFA) 108 (90 Base) MCG/ACT inhaler Inhale 1-2 puffs into the lungs every 4 (four) hours as needed for wheezing or shortness of breath. Patient not taking: Reported on 11/09/2017 09/08/15 09/04/20  Catha Gosselin, PA-C    Allergies    Patient has no known allergies.  Review of Systems   Review of Systems  All other systems reviewed and are negative.   Physical Exam Updated Vital Signs BP (!) 148/101   Pulse (!) 120   Temp 98.1 F (36.7 C)   Resp (!) 23   SpO2 99%   Physical Exam Vitals and nursing note reviewed.   25 year old female, resting comfortably and in no acute distress. Vital signs are significant for elevated  heart rate and blood pressure and respiratory rate. Oxygen saturation is 99%, which is normal. Head is normocephalic and atraumatic. PERRLA, EOMI. Oropharynx is clear.  Tympanic membranes are clear without any erythema or swelling. Neck is nontender and supple without adenopathy or JVD. Back is nontender and there is no CVA tenderness. Lungs are clear without rales, wheezes, or rhonchi. Chest is nontender. Heart has regular rate and rhythm without murmur. Abdomen is soft, flat, nontender without masses or hepatosplenomegaly and peristalsis is normoactive. Extremities have no cyanosis or edema, full range of motion is present. Skin is warm and dry without rash. Neurologic: Mental status is normal, cranial nerves are intact, there are no motor or sensory deficits.  ED Results / Procedures / Treatments    Procedures Procedures   Medications Ordered in ED Medications - No data to display  ED Course  I have reviewed the triage vital signs and the nursing notes.  MDM Rules/Calculators/A&P Insect in the left ear which had been removed at triage.  Abnormal vital signs are felt to be related to anxiety of having the foreign body in her ear.  On my exam, she is breathing normally and heart rate is normal.  Old records are reviewed, and she has no relevant past visits.  Patient is reassured  that there was no damage to her ear and is discharged.  Final Clinical Impression(s) / ED Diagnoses Final diagnoses:  Foreign body of left ear, initial encounter    Rx / DC Orders ED Discharge Orders    None       Dione Booze, MD 09/04/20 [phone]

## 2020-09-04 NOTE — ED Triage Notes (Signed)
Pt sts crawling sensation in left ear. Bug removed in triage.

## 2020-12-24 ENCOUNTER — Other Ambulatory Visit: Payer: Self-pay | Admitting: Internal Medicine

## 2021-02-12 ENCOUNTER — Encounter (HOSPITAL_BASED_OUTPATIENT_CLINIC_OR_DEPARTMENT_OTHER): Payer: Self-pay

## 2021-02-12 ENCOUNTER — Emergency Department (HOSPITAL_BASED_OUTPATIENT_CLINIC_OR_DEPARTMENT_OTHER)
Admission: EM | Admit: 2021-02-12 | Discharge: 2021-02-12 | Disposition: A | Discharge status: Home / Self Care | Payer: 59 | Attending: Emergency Medicine | Admitting: Emergency Medicine

## 2021-02-12 ENCOUNTER — Emergency Department (HOSPITAL_BASED_OUTPATIENT_CLINIC_OR_DEPARTMENT_OTHER): Payer: 59 | Admitting: Radiology

## 2021-02-12 ENCOUNTER — Other Ambulatory Visit: Payer: Self-pay

## 2021-02-12 DIAGNOSIS — R079 Chest pain, unspecified: Secondary | ICD-10-CM | POA: Diagnosis present

## 2021-02-12 DIAGNOSIS — R0789 Other chest pain: Secondary | ICD-10-CM | POA: Insufficient documentation

## 2021-02-12 DIAGNOSIS — F17290 Nicotine dependence, other tobacco product, uncomplicated: Secondary | ICD-10-CM | POA: Diagnosis not present

## 2021-02-12 LAB — URINALYSIS, ROUTINE W REFLEX MICROSCOPIC
Bilirubin Urine: NEGATIVE
Glucose, UA: NEGATIVE mg/dL
Hgb urine dipstick: NEGATIVE
Ketones, ur: NEGATIVE mg/dL
Leukocytes,Ua: NEGATIVE
Nitrite: NEGATIVE
Specific Gravity, Urine: 1.028 (ref 1.005–1.030)
pH: 6.5 (ref 5.0–8.0)

## 2021-02-12 LAB — D-DIMER, QUANTITATIVE: D-Dimer, Quant: 0.34 ug/mL-FEU (ref 0.00–0.50)

## 2021-02-12 LAB — CBC
HCT: 40.4 % (ref 36.0–46.0)
Hemoglobin: 13.9 g/dL (ref 12.0–15.0)
MCH: 29.6 pg (ref 26.0–34.0)
MCHC: 34.4 g/dL (ref 30.0–36.0)
MCV: 86.1 fL (ref 80.0–100.0)
Platelets: 330 10*3/uL (ref 150–400)
RBC: 4.69 MIL/uL (ref 3.87–5.11)
RDW: 14.6 % (ref 11.5–15.5)
WBC: 6.8 10*3/uL (ref 4.0–10.5)
nRBC: 0 % (ref 0.0–0.2)

## 2021-02-12 LAB — BASIC METABOLIC PANEL
Anion gap: 10 (ref 5–15)
BUN: 12 mg/dL (ref 6–20)
CO2: 23 mmol/L (ref 22–32)
Calcium: 9.4 mg/dL (ref 8.9–10.3)
Chloride: 105 mmol/L (ref 98–111)
Creatinine, Ser: 0.79 mg/dL (ref 0.44–1.00)
GFR, Estimated: >60 mL/min (ref >60)
Glucose, Bld: 92 mg/dL (ref 70–99)
Potassium: 3.5 mmol/L (ref 3.5–5.1)
Sodium: 138 mmol/L (ref 135–145)

## 2021-02-12 LAB — PREGNANCY, URINE: Preg Test, Ur: NEGATIVE

## 2021-02-12 LAB — TROPONIN I (HIGH SENSITIVITY): Troponin I (High Sensitivity): <2 ng/L (ref <18)

## 2021-02-12 MED ORDER — FAMOTIDINE 20 MG PO TABS: 20.0000 mg | ORAL_TABLET | Freq: Every day | ORAL | 0 refills | Status: DC

## 2021-02-12 MED ORDER — ALUM & MAG HYDROXIDE-SIMETH 200-200-20 MG/5ML PO SUSP
30.0000 mL | Freq: Once | ORAL | Status: AC
  Administered 2021-02-12: 30 mL via ORAL
  Filled 2021-02-12: qty 30

## 2021-02-12 MED ORDER — KETOROLAC TROMETHAMINE 30 MG/ML IJ SOLN
30.0000 mg | Freq: Once | INTRAMUSCULAR | Status: DC
  Filled 2021-02-12: qty 1

## 2021-02-12 NOTE — ED Notes (Signed)
This RN presented the AVS utilizing Teachback Method. Patient verbalizes understanding of Discharge Instructions. Opportunity for Questioning and Answers were provided. Patient Discharged from ED ambulatory to Home via SELF  

## 2021-02-12 NOTE — ED Notes (Signed)
Patient tolerated PO Intake well.

## 2021-02-12 NOTE — ED Triage Notes (Signed)
Pt presents with mid-sternal/epigastric chest pain since last Wednesday.  Reports pain is worse with movement.  She reports some lightheadedness, denies nausea, vomiting, or shortness of breath.  Reports history of costochondritis.   Also states she has experienced increased stress last week.

## 2021-02-12 NOTE — ED Provider Notes (Signed)
[facility] EMERGENCY DEPT Provider Note   CSN: [id number] Arrival date & time: 02/12/21  1557     History Chief Complaint  Patient presents with   Chest Pain    Karen Benitez is a 25 y.o. female.  Pt presents to the ED today with chest pain.  Sx started a week ago.  She feels like it is costochondritis.  She also feels very anxious and has had increased stress.  She was unable to get an appt with her pcp or her therapist.  Pain is worse when sitting up.  No sob or n/v.      History reviewed. No pertinent past medical history.  There are no problems to display for this patient.   History reviewed. No pertinent surgical history.   OB History     Gravida  2   Para  0   Term      Preterm  0   AB  2   Living  0      SAB      IAB      Ectopic      Multiple      Live Births              No family history on file.  Social History   Tobacco Use   Smoking status: Some Days    Types: Cigars   Smokeless tobacco: Never  Vaping Use   Vaping Use: Some days  Substance Use Topics   Alcohol use: Yes    Alcohol/week: 1.0 standard drink    Types: 1 Cans of beer per week    Comment: socially on weekends   Drug use: Yes    Frequency: 2.0 times per week    Types: Marijuana    Home Medications Prior to Admission medications   Medication Sig Start Date End Date Taking? Authorizing Provider  famotidine (PEPCID) 20 MG tablet Take 1 tablet (20 mg total) by mouth daily. 02/12/21  Yes Jacalyn Lefevre, MD  albuterol (PROVENTIL HFA;VENTOLIN HFA) 108 (90 Base) MCG/ACT inhaler Inhale 1-2 puffs into the lungs every 4 (four) hours as needed for wheezing or shortness of breath. Patient not taking: Reported on 11/09/2017 09/08/15 09/04/20  Catha Gosselin, PA-C    Allergies    Patient has no known allergies.  Review of Systems   Review of Systems  Cardiovascular:  Positive for chest pain.  All other systems reviewed and are negative.  Physical  Exam Updated Vital Signs BP (!) 139/91 (BP Location: Right Arm)   Pulse (!) 56   Temp 97.7 F (36.5 C) (Oral)   Resp 16   Ht 5\' 5"  (1.651 m)   Wt 73 kg   LMP 01/28/2021   SpO2 100%   BMI 26.79 kg/m   Physical Exam Vitals and nursing note reviewed.  Constitutional:      Appearance: She is well-developed.  HENT:     Head: Normocephalic and atraumatic.  Eyes:     Extraocular Movements: Extraocular movements intact.     Pupils: Pupils are equal, round, and reactive to light.  Cardiovascular:     Rate and Rhythm: Normal rate and regular rhythm.     Heart sounds: Normal heart sounds.  Pulmonary:     Effort: Pulmonary effort is normal.     Breath sounds: Normal breath sounds.  Abdominal:     General: Bowel sounds are normal.     Palpations: Abdomen is soft.  Musculoskeletal:        General:  Normal range of motion.     Cervical back: Normal range of motion and neck supple.  Skin:    General: Skin is warm and dry.     Capillary Refill: Capillary refill takes less than 2 seconds.  Neurological:     General: No focal deficit present.     Mental Status: She is alert and oriented to person, place, and time.  Psychiatric:        Mood and Affect: Mood normal.        Behavior: Behavior normal.    ED Results / Procedures / Treatments   Labs (all labs ordered are listed, but only abnormal results are displayed) Labs Reviewed  URINALYSIS, ROUTINE W REFLEX MICROSCOPIC - Abnormal; Notable for the following components:      Result Value   Protein, ur TRACE (*)    All other components within normal limits  BASIC METABOLIC PANEL  CBC  PREGNANCY, URINE  D-DIMER, QUANTITATIVE  TROPONIN I (HIGH SENSITIVITY)  TROPONIN I (HIGH SENSITIVITY)    EKG EKG Interpretation  Date/Time:  Tuesday February 12 2021 16:17:59 EDT Ventricular Rate:  94 PR Interval:  134 QRS Duration: 74 QT Interval:  346 QTC Calculation: 432 R Axis:   84 Text Interpretation: Normal sinus rhythm with sinus  arrhythmia Right atrial enlargement Septal infarct , age undetermined Abnormal ECG Since last tracing rate slower Confirmed by Jacalyn Lefevre [phone]) on 02/12/2021 4:59:12 PM  Radiology DG Chest 2 View  Result Date: 02/12/2021 CLINICAL DATA:  Chest pain EXAM: CHEST - 2 VIEW COMPARISON:  Chest radiograph 11/09/2017 FINDINGS: The cardiomediastinal silhouette is normal. The lungs are clear, with no focal consolidation or pulmonary edema. There is no pleural effusion or pneumothorax. The bones are unremarkable. IMPRESSION: No radiographic evidence of acute cardiopulmonary process. Electronically Signed   By: Lesia Hausen M.D.   On: 02/12/2021 16:43    Procedures Procedures   Medications Ordered in ED Medications  ketorolac (TORADOL) 30 MG/ML injection 30 mg (30 mg Intravenous Not Given 02/12/21 1724)  alum & mag hydroxide-simeth (MAALOX/MYLANTA) 200-200-20 MG/5ML suspension 30 mL (has no administration in time range)    ED Course  I have reviewed the triage vital signs and the nursing notes.  Pertinent labs & imaging results that were available during my care of the patient were reviewed by me and considered in my medical decision making (see chart for details).    MDM Rules/Calculators/A&P                           Pt is feeling much better.  She has a heart score of 0.  Ddimer is negative.  Pt is stable for d/c.  Return if worse. Final Clinical Impression(s) / ED Diagnoses Final diagnoses:  Atypical chest pain    Rx / DC Orders ED Discharge Orders          Ordered    famotidine (PEPCID) 20 MG tablet  Daily        02/12/21 1934             Jacalyn Lefevre, MD 02/12/21 1936

## 2021-12-17 ENCOUNTER — Encounter (HOSPITAL_BASED_OUTPATIENT_CLINIC_OR_DEPARTMENT_OTHER): Payer: Self-pay | Admitting: Obstetrics and Gynecology

## 2021-12-17 ENCOUNTER — Emergency Department (HOSPITAL_BASED_OUTPATIENT_CLINIC_OR_DEPARTMENT_OTHER)
Admission: EM | Admit: 2021-12-17 | Discharge: 2021-12-17 | Disposition: A | Discharge status: Home / Self Care | Payer: Managed Care, Other (non HMO) | Attending: Emergency Medicine | Admitting: Emergency Medicine

## 2021-12-17 ENCOUNTER — Other Ambulatory Visit: Payer: Self-pay

## 2021-12-17 DIAGNOSIS — Y92410 Unspecified street and highway as the place of occurrence of the external cause: Secondary | ICD-10-CM | POA: Diagnosis not present

## 2021-12-17 DIAGNOSIS — S199XXA Unspecified injury of neck, initial encounter: Secondary | ICD-10-CM | POA: Diagnosis present

## 2021-12-17 DIAGNOSIS — S161XXA Strain of muscle, fascia and tendon at neck level, initial encounter: Secondary | ICD-10-CM | POA: Diagnosis not present

## 2021-12-17 MED ORDER — KETOROLAC TROMETHAMINE 10 MG PO TABS: 10.0000 mg | ORAL_TABLET | Freq: Four times a day (QID) | ORAL | 0 refills | Status: DC | PRN

## 2021-12-17 MED ORDER — OXYCODONE-ACETAMINOPHEN 5-325 MG PO TABS
2.0000 | ORAL_TABLET | Freq: Once | ORAL | Status: AC
  Administered 2021-12-17: 2 via ORAL
  Filled 2021-12-17: qty 2

## 2021-12-17 MED ORDER — CYCLOBENZAPRINE HCL 10 MG PO TABS: 10.0000 mg | ORAL_TABLET | Freq: Two times a day (BID) | ORAL | 0 refills | Status: DC | PRN

## 2021-12-17 MED ORDER — KETOROLAC TROMETHAMINE 30 MG/ML IJ SOLN
30.0000 mg | Freq: Once | INTRAMUSCULAR | Status: AC
  Administered 2021-12-17: 30 mg via INTRAMUSCULAR
  Filled 2021-12-17: qty 1
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
## 2022-04-14 IMAGING — DX DG CHEST 2V
2 series · 2 of 2 positions shown · non-contrast
Comparison: Chest radiograph 11/09/2017

CLINICAL DATA: Chest pain

EXAM:
CHEST - 2 VIEW

[chest pa]
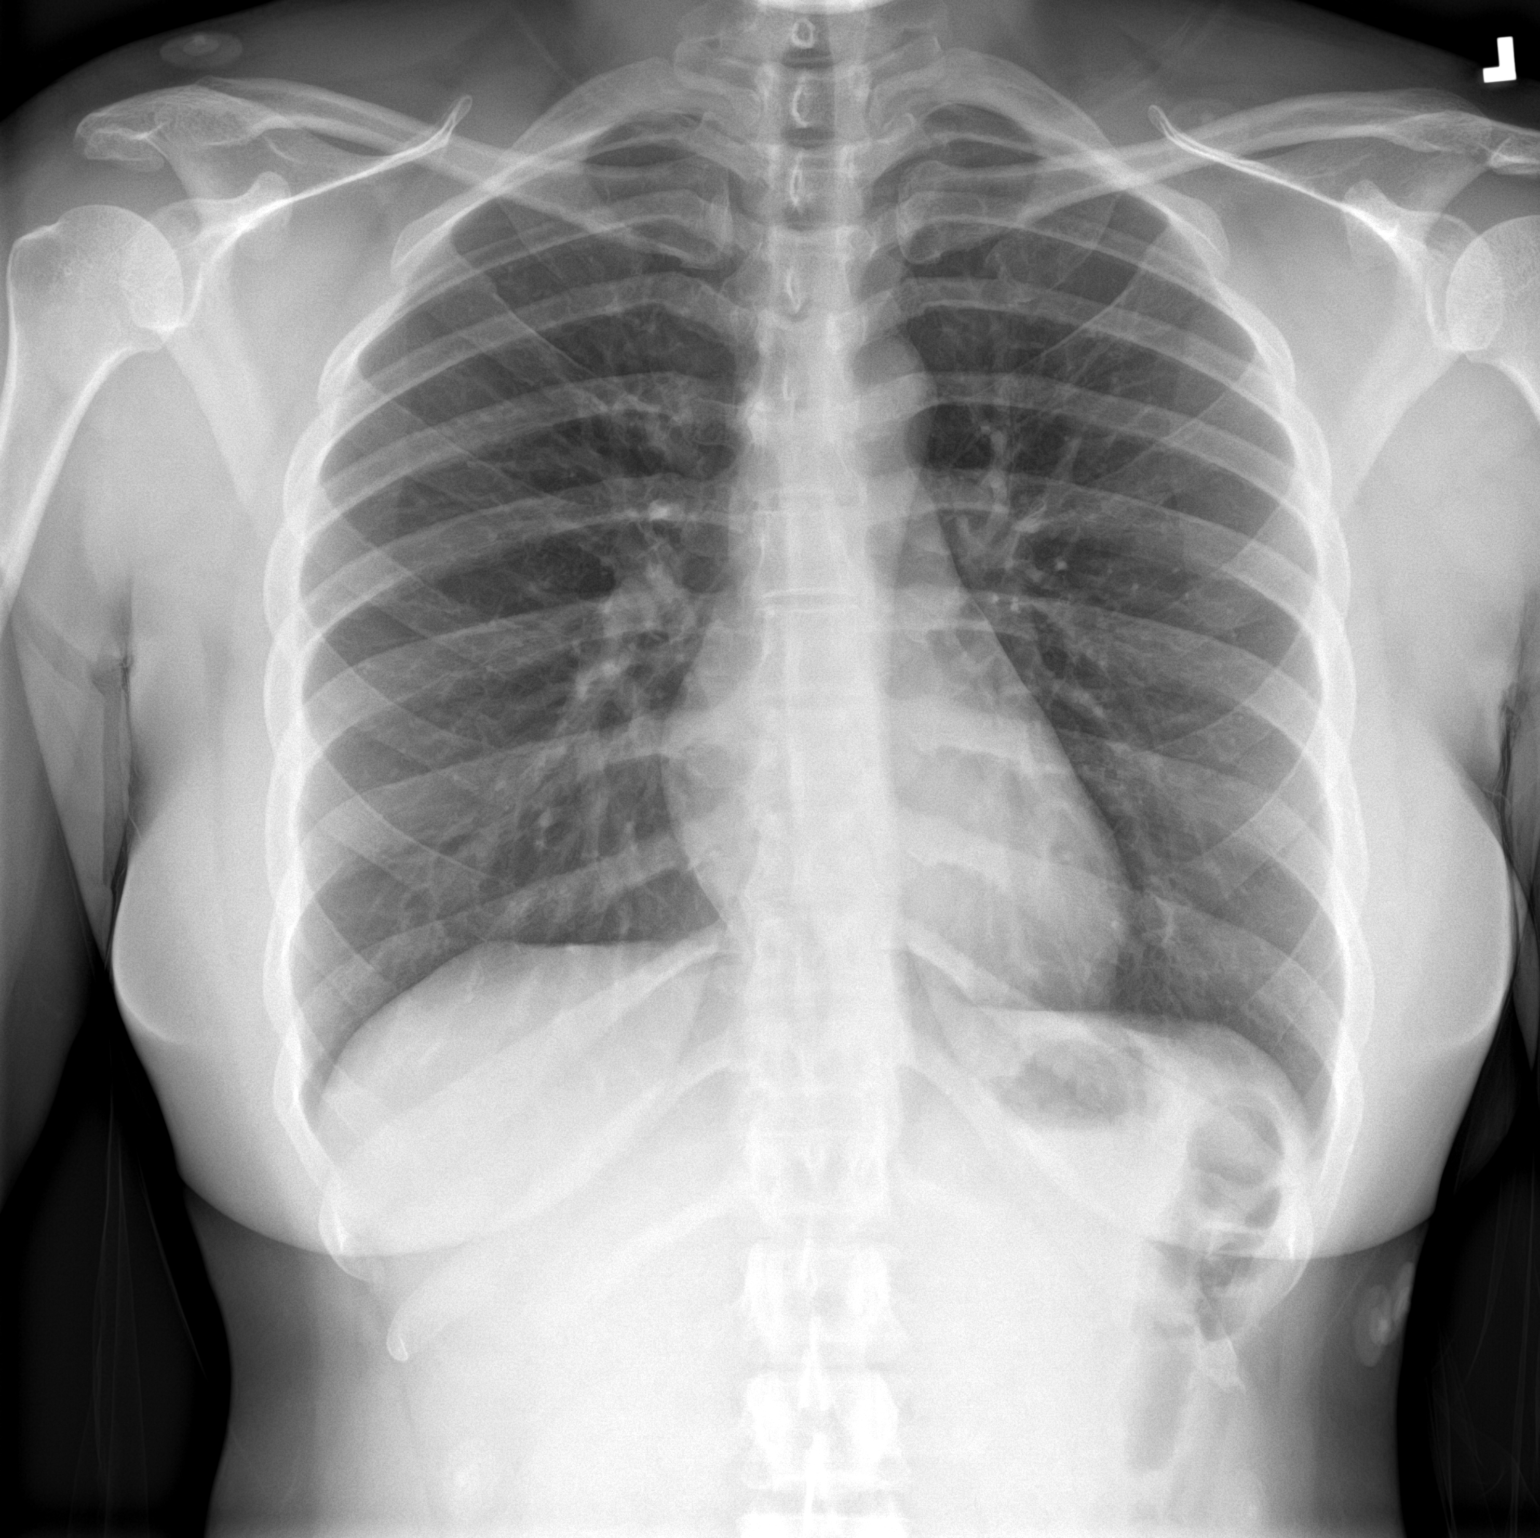

[chest lat]
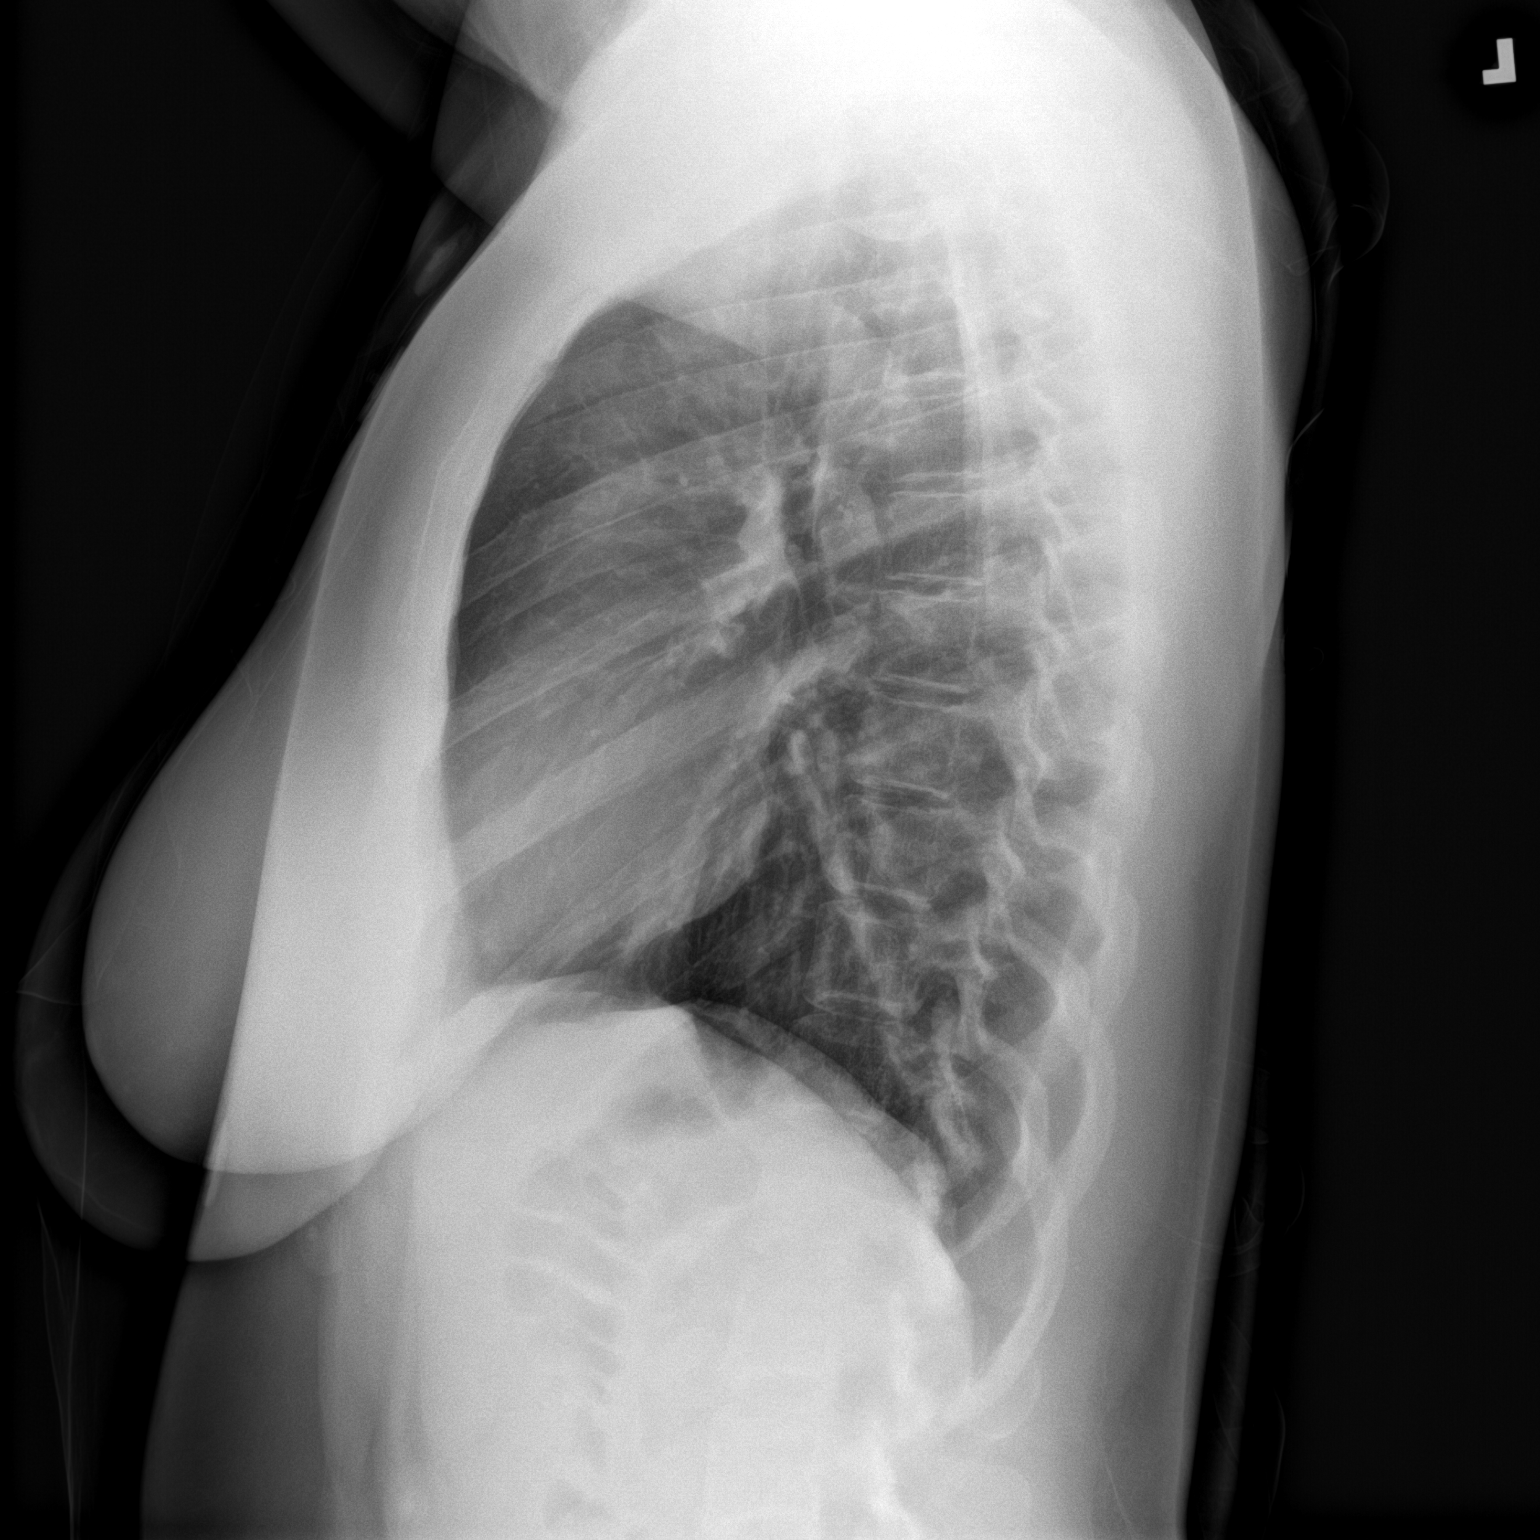

[2 of 2 positions shown; findings below may reference images not displayed]

FINDINGS: The cardiomediastinal silhouette is normal.

The lungs are clear, with no focal consolidation or pulmonary edema.
There is no pleural effusion or pneumothorax.

The bones are unremarkable.
IMPRESSION: No radiographic evidence of acute cardiopulmonary process.

## 2022-05-28 ENCOUNTER — Encounter (HOSPITAL_BASED_OUTPATIENT_CLINIC_OR_DEPARTMENT_OTHER): Payer: Self-pay

## 2022-05-28 ENCOUNTER — Emergency Department (HOSPITAL_BASED_OUTPATIENT_CLINIC_OR_DEPARTMENT_OTHER)
Admission: EM | Admit: 2022-05-28 | Discharge: 2022-05-28 | Disposition: A | Discharge status: Home / Self Care | Payer: Managed Care, Other (non HMO) | Attending: Emergency Medicine | Admitting: Emergency Medicine

## 2022-05-28 ENCOUNTER — Emergency Department (HOSPITAL_BASED_OUTPATIENT_CLINIC_OR_DEPARTMENT_OTHER): Payer: Managed Care, Other (non HMO)

## 2022-05-28 ENCOUNTER — Other Ambulatory Visit: Payer: Self-pay

## 2022-05-28 DIAGNOSIS — R0789 Other chest pain: Secondary | ICD-10-CM | POA: Diagnosis present

## 2022-05-28 DIAGNOSIS — R079 Chest pain, unspecified: Secondary | ICD-10-CM

## 2022-05-28 LAB — BASIC METABOLIC PANEL
Anion gap: 10 (ref 5–15)
BUN: 15 mg/dL (ref 6–20)
CO2: 22 mmol/L (ref 22–32)
Calcium: 9.1 mg/dL (ref 8.9–10.3)
Chloride: 105 mmol/L (ref 98–111)
Creatinine, Ser: 0.84 mg/dL (ref 0.44–1.00)
GFR, Estimated: >60 mL/min (ref >60)
Glucose, Bld: 82 mg/dL (ref 70–99)
Potassium: 4.1 mmol/L (ref 3.5–5.1)
Sodium: 137 mmol/L (ref 135–145)

## 2022-05-28 LAB — TROPONIN I (HIGH SENSITIVITY): Troponin I (High Sensitivity): <2 ng/L (ref <18)

## 2022-05-28 LAB — CBC
HCT: 38.3 % (ref 36.0–46.0)
Hemoglobin: 13.1 g/dL (ref 12.0–15.0)
MCH: 30 pg (ref 26.0–34.0)
MCHC: 34.2 g/dL (ref 30.0–36.0)
MCV: 87.8 fL (ref 80.0–100.0)
Platelets: 267 10*3/uL (ref 150–400)
RBC: 4.36 MIL/uL (ref 3.87–5.11)
RDW: 13.8 % (ref 11.5–15.5)
WBC: 4.4 10*3/uL (ref 4.0–10.5)
nRBC: 0 % (ref 0.0–0.2)

## 2022-05-28 MED ORDER — LIDOCAINE-EPINEPHRINE (PF) 2 %-1:200000 IJ SOLN: 20.0000 mL | Freq: Once | INTRAMUSCULAR | Status: DC

## 2022-05-28 NOTE — ED Provider Notes (Signed)
[facility] EMERGENCY DEPT Provider Note   CSN: [id number] Arrival date & time: 05/28/22  0931     History  Chief Complaint  Patient presents with   Chest Pain    Karen Benitez is a 26 y.o. female presents to the ED complaining of right-sided chest tightness and right arm heaviness.  She states the symptoms have been going on for the past several days, but seem to be getting worse.  She reports her chest feels more of a tightness and discomfort.  She is concerned because her symptoms have radiated into her right arm.  She denies numbness or weakness in her right arm, states that just "feels funny".  She has been evaluated and seen for atypical chest pain over the past few years.  She is not currently taking any medications for high blood pressure or heart problems.  She is not on birth control.  She denies shortness of breath, nausea, fever, cough, recent respiratory illness, dyspnea on exertion.      Home Medications Prior to Admission medications   Medication Sig Start Date End Date Taking? Authorizing Provider  cyclobenzaprine (FLEXERIL) 10 MG tablet Take 1 tablet (10 mg total) by mouth 2 (two) times daily as needed for muscle spasms. 12/17/21   Janell Quiet, PA-C  famotidine (PEPCID) 20 MG tablet Take 1 tablet (20 mg total) by mouth daily. 02/12/21   Jacalyn Lefevre, MD  ketorolac (TORADOL) 10 MG tablet Take 1 tablet (10 mg total) by mouth every 6 (six) hours as needed. 12/17/21   Janell Quiet, PA-C  albuterol (PROVENTIL HFA;VENTOLIN HFA) 108 (90 Base) MCG/ACT inhaler Inhale 1-2 puffs into the lungs every 4 (four) hours as needed for wheezing or shortness of breath. Patient not taking: Reported on 11/09/2017 09/08/15 09/04/20  Catha Gosselin, PA-C      Allergies    Patient has no known allergies.    Review of Systems   Review of Systems  Constitutional:  Negative for fever.  Respiratory:  Positive for chest tightness. Negative for cough, shortness of breath  and wheezing.   Cardiovascular:  Positive for chest pain. Negative for palpitations and leg swelling.  Gastrointestinal:  Negative for nausea and vomiting.  Musculoskeletal:        Right arm heaviness  Neurological:  Negative for syncope, weakness and light-headedness.    Physical Exam Updated Vital Signs BP (!) 125/93   Pulse 71   Temp 98.4 F (36.9 C) (Oral)   Resp 20   Ht 5\' 5"  (1.651 m)   Wt 74.4 kg   LMP 05/12/2022 (Exact Date)   SpO2 100%   BMI 27.29 kg/m  Physical Exam Vitals and nursing note reviewed.  Constitutional:      General: She is not in acute distress.    Appearance: Normal appearance. She is not ill-appearing or diaphoretic.  Cardiovascular:     Rate and Rhythm: Normal rate and regular rhythm.     Pulses: Normal pulses.     Heart sounds: Normal heart sounds.  Pulmonary:     Effort: Pulmonary effort is normal. No accessory muscle usage or respiratory distress.     Breath sounds: Normal breath sounds and air entry. No decreased breath sounds or wheezing.  Chest:     Chest wall: No tenderness or crepitus.     Comments: No tenderness on palpation of the right chest wall, chest pain does not change with deep inspiration Abdominal:     General: Abdomen is flat.  Palpations: Abdomen is soft.  Musculoskeletal:     Right lower leg: No edema.     Left lower leg: No edema.  Skin:    General: Skin is warm and dry.     Capillary Refill: Capillary refill takes less than 2 seconds.  Neurological:     Mental Status: She is alert. Mental status is at baseline.     Sensory: Sensory deficit present.     Motor: Motor function is intact.     Comments: Patient reports decree sensation to proximal right upper extremity, normal sensation to the forearm and hand, does not appear to follow specific dermatome; grip strength is equal  Psychiatric:        Mood and Affect: Mood normal.        Behavior: Behavior normal.     ED Results / Procedures / Treatments    Labs (all labs ordered are listed, but only abnormal results are displayed) Labs Reviewed  BASIC METABOLIC PANEL  CBC  TROPONIN I (HIGH SENSITIVITY)    EKG EKG Interpretation  Date/Time:  Wednesday May 28 2022 09:38:01 EST Ventricular Rate:  62 PR Interval:  146 QRS Duration: 82 QT Interval:  399 QTC Calculation: 406 R Axis:   68 Text Interpretation: Sinus rhythm Atrial premature complex No significant change since last tracing Confirmed by Melene Plan [phone]) on 05/28/2022 9:43:00 AM  Radiology DG Chest Port 1 View  Result Date: 05/28/2022 CLINICAL DATA:  Right-sided chest pain. Intermittent right arm numbness. EXAM: PORTABLE CHEST 1 VIEW COMPARISON:  02/12/2021; 11/09/2017 FINDINGS: Normal cardiac silhouette and mediastinal contours. No focal parenchymal opacities. No pleural effusion or pneumothorax. No evidence of edema. No acute osseous abnormalities. IMPRESSION: No acute cardiopulmonary disease. Electronically Signed   By: Simonne Come M.D.   On: 05/28/2022 10:01    Procedures Procedures    Medications Ordered in ED Medications - No data to display  ED Course/ Medical Decision Making/ A&P                           Medical Decision Making Amount and/or Complexity of Data Reviewed Labs: ordered. Radiology: ordered.   This patient presents to the ED with chief complaint(s) of chest tightness and discomfort with right arm heaviness with pertinent past medical history of atypical chest pain .The complaint involves an extensive differential diagnosis and also carries with it a high risk of complications and morbidity.    The differential diagnosis includes ACS, atypical chest pain, PE, muscle strain, radiculopathy; low suspicion for thoracic outlet syndrome based on presenting symptoms  The initial plan is to obtain chest pain labs and chest x ray  Initial Assessment:   Patient is well-appearing, resting comfortably in bed, in no acute distress.  Heart rate and  rhythm are normal.  Heart sounds normal, no murmurs.  Lungs clear to auscultation bilaterally with good lung volumes.  Right-sided chest pain is not reproducible with palpation or deep inspiration.  Neuro exam only significant for minimally decreased sensation to the proximal right upper extremity when compared to left, does not follow specific dermatome.  Grip strength is equal.  Normal sensation to the forearm and hand on the right side.  No bilateral lower extremity edema.  Patient is able to move all extremities without difficulty and with fully ROM.  Independent ECG/labs interpretation:  The following labs were independently interpreted:  CBC demonstrates no leukocytosis, anemia, or abnormal platelet count. Metabolic panel demonstrates no electrolyte disturbance, she  has normal kidney function. ECG reveals sinus rhythm with atrial premature complex, no evidence of ischemia or infarction, no evidence of hypertrophy  Independent visualization and interpretation of imaging: I independently visualized the following imaging with scope of interpretation limited to determining acute life threatening conditions related to emergency care: Chest x-ray, which revealed no evidence of pneumothorax, pneumonia, pleural effusion; heart is of normal size  Disposition:   After consideration of the diagnostic results and the patients response to treatment, I feel that emergency department workup does not suggest an emergent condition requiring admission or immediate intervention beyond what has been performed at this time.  The patient is safe for discharge and has been instructed to return immediately for worsening symptoms, change in symptoms or any other concerns.  Discussed with patient laboratory findings and reassuring work-up.  Recommended she follow-up with her primary care physician.    Discussed HPI, physical exam findings, assessment and plan with attending Melene Plan who agrees with current plan.           Final Clinical Impression(s) / ED Diagnoses Final diagnoses:  Nonspecific chest pain    Rx / DC Orders ED Discharge Orders     None         Lenard Simmer, PA 05/28/22 1133    Melene Plan, DO 05/28/22 1153

## 2022-05-28 NOTE — Discharge Instructions (Addendum)
Thank you for allowing me to be part of your care today.  Your laboratory work-up was overall reassuring, your cardiac enzymes are negative.  I highly recommend you follow-up with your primary care physician for your symptoms.   You may take 800 mg of ibuprofen every 6-8 hours as needed for chest discomfort.  Stay well-hydrated while taking this medication.  Return to the ER if you develop any new or worsening symptoms or have any new concerns.

## 2022-05-28 NOTE — ED Triage Notes (Signed)
Pt states having right sided chest pain. Feels like her right arm has intermittent numbness. Denies shortness of breath or nausea.

## 2022-06-11 ENCOUNTER — Ambulatory Visit: Payer: Managed Care, Other (non HMO) | Admitting: Physician Assistant

## 2022-06-12 ENCOUNTER — Encounter: Payer: Self-pay | Admitting: *Deleted

## 2023-03-30 NOTE — Progress Notes (Unsigned)
Cardiology Office Note:    Date:  03/30/2023  NAME:  Karen Benitez    MRN: [id number] DOB:  [birth year]   PCP:  Patient, No Pcp Per  Former Cardiology Providers: *** Primary Cardiologist:  Tessa Lerner, DO, [facility] (established care 03/30/23) Electrophysiologist:  None   Referring MD: Lindaann Pascal, PA-C  Reason of Consult: ***  No chief complaint on file. ***  History of Present Illness:    Karen Benitez is a 27 y.o. [place] female whose past medical history and cardiovascular risk factors includes: ***. She is being seen today for the evaluation of palpitations/cardiac murmur at the request of Long, Scott, PA-C.  ***  Add fhx   Current Medications: No outpatient medications have been marked as taking for the 03/31/23 encounter (Appointment) with Odis Hollingshead, Asheley Hellberg, DO.     Allergies:    Patient has no known allergies.   Past Medical History: Past Medical History:  Diagnosis Date   Chest pain     Past Surgical History: Past Surgical History:  Procedure Laterality Date   NO PAST SURGERIES      Social History: Social History   Tobacco Use   Smoking status: Some Days    Types: Cigars   Smokeless tobacco: Never  Vaping Use   Vaping status: Some Days  Substance Use Topics   Alcohol use: Yes    Alcohol/week: 1.0 standard drink of alcohol    Types: 1 Cans of beer per week    Comment: socially on weekends   Drug use: Yes    Frequency: 2.0 times per week    Types: Marijuana    Family History: No family history on file.  ROS:   ROS  EKGs/Labs/Other Studies Reviewed:   The ekg ordered today was personally reviewed by me. *** EKG Interpretation Date/Time:    Ventricular Rate:    PR Interval:    QRS Duration:    QT Interval:    QTC Calculation:   R Axis:      Text Interpretation:          ***  Labs:    Latest Ref Rng & Units 05/28/2022   10:00 AM 02/12/2021    4:22 PM 11/09/2017    1:58 PM  CBC  WBC 4.0 - 10.5 K/uL 4.4  6.8  5.6   Hemoglobin  12.0 - 15.0 g/dL 40.9  81.1  91.4   Hematocrit 36.0 - 46.0 % 38.3  40.4  40.1   Platelets 150 - 400 K/uL 267  330  317        Latest Ref Rng & Units 05/28/2022   10:00 AM 02/12/2021    4:22 PM 11/09/2017    1:58 PM  BMP  Glucose 70 - 99 mg/dL 82  92  89   BUN 6 - 20 mg/dL 15  12  13    Creatinine 0.44 - 1.00 mg/dL 7.82  9.56  2.13   Sodium 135 - 145 mmol/L 137  138  140   Potassium 3.5 - 5.1 mmol/L 4.1  3.5  4.1   Chloride 98 - 111 mmol/L 105  105  107   CO2 22 - 32 mmol/L 22  23  24    Calcium 8.9 - 10.3 mg/dL 9.1  9.4  9.3       Latest Ref Rng & Units 05/28/2022   10:00 AM 02/12/2021    4:22 PM 11/09/2017    1:58 PM  CMP  Glucose 70 - 99 mg/dL 82  92  89  BUN 6 - 20 mg/dL 15  12  13    Creatinine 0.44 - 1.00 mg/dL 1.61  0.96  0.45   Sodium 135 - 145 mmol/L 137  138  140   Potassium 3.5 - 5.1 mmol/L 4.1  3.5  4.1   Chloride 98 - 111 mmol/L 105  105  107   CO2 22 - 32 mmol/L 22  23  24    Calcium 8.9 - 10.3 mg/dL 9.1  9.4  9.3     No results found for: "CHOL", "HDL", "LDLCALC", "LDLDIRECT", "TRIG", "CHOLHDL" No results for input(s): "LIPOA" in the last 8760 hours. No components found for: "NTPROBNP" No results for input(s): "PROBNP" in the last 8760 hours. No results for input(s): "TSH" in the last 8760 hours.  Physical Exam:   There were no vitals filed for this visit. There is no height or weight on file to calculate BMI. Wt Readings from Last 3 Encounters:  05/28/22 164 lb (74.4 kg)  12/17/21 165 lb (74.8 kg)  02/12/21 161 lb (73 kg)    Physical Exam   Impression & Recommendation(s):  Impression:   ICD-10-CM   1. Palpitations  R00.2     2. Cardiac murmur  R01.1        Recommendation(s):  ***  Orders Placed:  No orders of the defined types were placed in this encounter.   As part of medical decision making results of the *** were reviewed independently at today's visit.   Final Medication List:   No orders of the defined types were placed in this  encounter.   There are no discontinued medications.   Current Outpatient Medications:    cyclobenzaprine (FLEXERIL) 10 MG tablet, Take 1 tablet (10 mg total) by mouth 2 (two) times daily as needed for muscle spasms., Disp: 20 tablet, Rfl: 0   famotidine (PEPCID) 20 MG tablet, Take 1 tablet (20 mg total) by mouth daily., Disp: 30 tablet, Rfl: 0   ketorolac (TORADOL) 10 MG tablet, Take 1 tablet (10 mg total) by mouth every 6 (six) hours as needed., Disp: 20 tablet, Rfl: 0  Consent:      {Are you ordering a CV Procedure (e.g. stress test, cath, DCCV, TEE, etc)?   Press F2        :[id number]}   Disposition:   No follow-ups on file. or sooner if needed.  Her questions and concerns were addressed to her satisfaction. She voices understanding of the recommendations provided during this encounter.    Signed, Tessa Lerner, DO, [facility]  [facility]  [address] [place], [place] [address] [phone] 03/30/2023 10:08 PM

## 2023-03-31 ENCOUNTER — Encounter: Payer: Self-pay | Admitting: Cardiology

## 2023-03-31 ENCOUNTER — Ambulatory Visit: Payer: Managed Care, Other (non HMO) | Attending: Cardiology | Admitting: Cardiology

## 2023-03-31 VITALS — BP 118/84 | HR 72 | Resp 16 | Ht 65.0 in | Wt 167.0 lb

## 2023-03-31 DIAGNOSIS — F17200 Nicotine dependence, unspecified, uncomplicated: Secondary | ICD-10-CM | POA: Diagnosis not present

## 2023-03-31 DIAGNOSIS — R002 Palpitations: Secondary | ICD-10-CM

## 2023-03-31 DIAGNOSIS — R072 Precordial pain: Secondary | ICD-10-CM

## 2023-03-31 DIAGNOSIS — R011 Cardiac murmur, unspecified: Secondary | ICD-10-CM

## 2023-03-31 NOTE — Patient Instructions (Addendum)
Medication Instructions:  Your physician recommends that you continue on your current medications as directed. Please refer to the Current Medication list given to you today.  *If you need a refill on your cardiac medications before your next appointment, please call your pharmacy*   Lab Work: Please complete an H&H and a TSH in our lab today before you leave.  If you have labs (blood work) drawn today and your tests are completely normal, you will receive your results only by: MyChart Message (if you have MyChart) OR A paper copy in the mail If you have any lab test that is abnormal or we need to change your treatment, we will call you to review the results.   Testing/Procedures: Your physician has requested that you have an echocardiogram. Echocardiography is a painless test that uses sound waves to create images of your heart. It provides your doctor with information about the size and shape of your heart and how well your heart's chambers and valves are working. This procedure takes approximately one hour. There are no restrictions for this procedure. Please do NOT wear cologne, perfume, aftershave, or lotions (deodorant is allowed). Please arrive 15 minutes prior to your appointment time.    Follow-Up: At [facility], you and your health needs are our priority.  As part of our continuing mission to provide you with exceptional heart care, we have created designated Provider Care Teams.  These Care Teams include your primary Cardiologist (physician) and Advanced Practice Providers (APPs -  Physician Assistants and Nurse Practitioners) who all work together to provide you with the care you need, when you need it.  We recommend signing up for the patient portal called "MyChart".  Sign up information is provided on this After Visit Summary.  MyChart is used to connect with patients for Virtual Visits (Telemedicine).  Patients are able to view lab/test results, encounter notes,  upcoming appointments, etc.  Non-urgent messages can be sent to your provider as well.   To learn more about what you can do with MyChart, go to [email or web address].    Your next appointment will be dependent on the results of your testing and it will be with:     Provider:   Tessa Lerner, DO     Please follow up with [facility] to establish care with a PCP. [address] [place], [place] [address] Phone: [phone]

## 2023-04-01 LAB — HEMOGLOBIN AND HEMATOCRIT, BLOOD
Hematocrit: 41.6 % (ref 34.0–46.6)
Hemoglobin: 13.4 g/dL (ref 11.1–15.9)

## 2023-04-01 LAB — TSH: TSH: 2.11 u[IU]/mL (ref 0.450–4.500)

## 2023-04-16 ENCOUNTER — Other Ambulatory Visit (HOSPITAL_COMMUNITY): Payer: Managed Care, Other (non HMO)

## 2023-04-21 ENCOUNTER — Ambulatory Visit (HOSPITAL_COMMUNITY): Payer: Managed Care, Other (non HMO) | Attending: Cardiology

## 2023-04-21 DIAGNOSIS — R079 Chest pain, unspecified: Secondary | ICD-10-CM

## 2023-04-21 DIAGNOSIS — R072 Precordial pain: Secondary | ICD-10-CM | POA: Diagnosis present

## 2023-04-21 LAB — ECHOCARDIOGRAM COMPLETE
Area-P 1/2: 3.47 cm2
S' Lateral: 2.9 cm

## 2023-06-26 ENCOUNTER — Ambulatory Visit: Payer: Managed Care, Other (non HMO) | Admitting: Family Medicine

## 2023-06-26 NOTE — Progress Notes (Deleted)
New Patient Office Visit  Subjective    Patient ID: Karen Benitez, female    DOB: 1995/07/23  Age: 27 y.o. MRN: [id number]  CC: No chief complaint on file.   HPI Karen Benitez presents to establish care ***  Outpatient Encounter Medications as of 06/26/2023  Medication Sig  . [DISCONTINUED] albuterol (PROVENTIL HFA;VENTOLIN HFA) 108 (90 Base) MCG/ACT inhaler Inhale 1-2 puffs into the lungs every 4 (four) hours as needed for wheezing or shortness of breath. (Patient not taking: Reported on 11/09/2017)   No facility-administered encounter medications on file as of 06/26/2023.    Past Medical History:  Diagnosis Date  . Chest pain     Past Surgical History:  Procedure Laterality Date  . NO PAST SURGERIES      No family history on file.  Social History   Socioeconomic History  . Marital status: Significant Other    Spouse name: Not on file  . Number of children: Not on file  . Years of education: Not on file  . Highest education level: Not on file  Occupational History  . Not on file  Tobacco Use  . Smoking status: Some Days    Types: Cigars  . Smokeless tobacco: Never  Vaping Use  . Vaping status: Some Days  Substance and Sexual Activity  . Alcohol use: Yes    Alcohol/week: 1.0 standard drink of alcohol    Types: 1 Cans of beer per week    Comment: socially on weekends  . Drug use: Yes    Frequency: 2.0 times per week    Types: Marijuana  . Sexual activity: Yes  Other Topics Concern  . Not on file  Social History Narrative  . Not on file   Social Drivers of Health   Financial Resource Strain: Not on file  Food Insecurity: Not on file  Transportation Needs: Not on file  Physical Activity: Not on file  Stress: Not on file  Social Connections: Not on file  Intimate Partner Violence: Not on file    ROS Per HPI      Objective    There were no vitals taken for this visit.  Physical Exam Vitals and nursing note reviewed.  Constitutional:       Appearance: Normal appearance. She is normal weight.  HENT:     Head: Normocephalic and atraumatic.     Right Ear: Tympanic membrane and ear canal normal.     Left Ear: Tympanic membrane and ear canal normal.     Nose: Nose normal.  Eyes:     Extraocular Movements: Extraocular movements intact.     Pupils: Pupils are equal, round, and reactive to light.  Cardiovascular:     Rate and Rhythm: Normal rate and regular rhythm.     Heart sounds: Normal heart sounds.  Pulmonary:     Effort: Pulmonary effort is normal.     Breath sounds: Normal breath sounds.  Musculoskeletal:        General: Normal range of motion.     Cervical back: Normal range of motion.  Neurological:     General: No focal deficit present.     Mental Status: She is alert and oriented to person, place, and time.  Psychiatric:        Mood and Affect: Mood normal.        Thought Content: Thought content normal.       Assessment & Plan:   There are no diagnoses linked to this encounter.   No  follow-ups on file.   Moshe Cipro, FNP

## 2023-07-27 NOTE — Progress Notes (Unsigned)
New Patient Office Visit  Subjective    Patient ID: Karen Benitez, female    DOB: August 22, 1995  Age: 28 y.o. MRN: [id number]  CC: No chief complaint on file.   HPI Karen Benitez presents to establish care ***  Outpatient Encounter Medications as of 07/28/2023  Medication Sig  . [DISCONTINUED] albuterol (PROVENTIL HFA;VENTOLIN HFA) 108 (90 Base) MCG/ACT inhaler Inhale 1-2 puffs into the lungs every 4 (four) hours as needed for wheezing or shortness of breath. (Patient not taking: Reported on 11/09/2017)   No facility-administered encounter medications on file as of 07/28/2023.    Past Medical History:  Diagnosis Date  . Chest pain     Past Surgical History:  Procedure Laterality Date  . NO PAST SURGERIES      No family history on file.  Social History   Socioeconomic History  . Marital status: Significant Other    Spouse name: Not on file  . Number of children: Not on file  . Years of education: Not on file  . Highest education level: Not on file  Occupational History  . Not on file  Tobacco Use  . Smoking status: Some Days    Types: Cigars  . Smokeless tobacco: Never  Vaping Use  . Vaping status: Some Days  Substance and Sexual Activity  . Alcohol use: Yes    Alcohol/week: 1.0 standard drink of alcohol    Types: 1 Cans of beer per week    Comment: socially on weekends  . Drug use: Yes    Frequency: 2.0 times per week    Types: Marijuana  . Sexual activity: Yes  Other Topics Concern  . Not on file  Social History Narrative  . Not on file   Social Drivers of Health   Financial Resource Strain: Not on file  Food Insecurity: Not on file  Transportation Needs: Not on file  Physical Activity: Not on file  Stress: Not on file  Social Connections: Not on file  Intimate Partner Violence: Not on file    ROS Per HPI      Objective    There were no vitals taken for this visit.  Physical Exam Vitals and nursing note reviewed.  Constitutional:       Appearance: Normal appearance. She is normal weight.  HENT:     Head: Normocephalic and atraumatic.     Right Ear: Tympanic membrane and ear canal normal.     Left Ear: Tympanic membrane and ear canal normal.     Nose: Nose normal.  Eyes:     Extraocular Movements: Extraocular movements intact.     Pupils: Pupils are equal, round, and reactive to light.  Cardiovascular:     Rate and Rhythm: Normal rate and regular rhythm.     Heart sounds: Normal heart sounds.  Pulmonary:     Effort: Pulmonary effort is normal.     Breath sounds: Normal breath sounds.  Musculoskeletal:        General: Normal range of motion.     Cervical back: Normal range of motion.  Neurological:     General: No focal deficit present.     Mental Status: She is alert and oriented to person, place, and time.  Psychiatric:        Mood and Affect: Mood normal.        Thought Content: Thought content normal.       Assessment & Plan:   There are no diagnoses linked to this encounter.   No  follow-ups on file.   Moshe Cipro, FNP

## 2023-07-28 ENCOUNTER — Encounter: Payer: Self-pay | Admitting: Family Medicine

## 2023-07-28 ENCOUNTER — Ambulatory Visit: Payer: Managed Care, Other (non HMO) | Admitting: Family Medicine

## 2023-07-28 VITALS — BP 124/80 | HR 83 | Temp 98.5°F | Ht 65.0 in | Wt 168.4 lb

## 2023-07-28 DIAGNOSIS — E663 Overweight: Secondary | ICD-10-CM

## 2023-07-28 DIAGNOSIS — Z136 Encounter for screening for cardiovascular disorders: Secondary | ICD-10-CM

## 2023-07-28 DIAGNOSIS — Z6828 Body mass index (BMI) 28.0-28.9, adult: Secondary | ICD-10-CM | POA: Diagnosis not present

## 2023-07-28 DIAGNOSIS — K2100 Gastro-esophageal reflux disease with esophagitis, without bleeding: Secondary | ICD-10-CM | POA: Diagnosis not present

## 2023-07-28 LAB — CBC WITH DIFFERENTIAL/PLATELET
Basophils Absolute: 0 10*3/uL (ref 0.0–0.1)
Basophils Relative: 0.7 % (ref 0.0–3.0)
Eosinophils Absolute: 0 10*3/uL (ref 0.0–0.7)
Eosinophils Relative: 0.3 % (ref 0.0–5.0)
HCT: 38.8 % (ref 36.0–46.0)
Hemoglobin: 12.9 g/dL (ref 12.0–15.0)
Lymphocytes Relative: 34.5 % (ref 12.0–46.0)
Lymphs Abs: 1.5 10*3/uL (ref 0.7–4.0)
MCHC: 33.1 g/dL (ref 30.0–36.0)
MCV: 88.5 fL (ref 78.0–100.0)
Monocytes Absolute: 0.3 10*3/uL (ref 0.1–1.0)
Monocytes Relative: 6.7 % (ref 3.0–12.0)
Neutro Abs: 2.5 10*3/uL (ref 1.4–7.7)
Neutrophils Relative %: 57.8 % (ref 43.0–77.0)
Platelets: 340 10*3/uL (ref 150.0–400.0)
RBC: 4.39 Mil/uL (ref 3.87–5.11)
RDW: 13.5 % (ref 11.5–15.5)
WBC: 4.4 10*3/uL (ref 4.0–10.5)

## 2023-07-28 LAB — COMPREHENSIVE METABOLIC PANEL
ALT: 20 U/L (ref 0–35)
AST: 23 U/L (ref 0–37)
Albumin: 4.2 g/dL (ref 3.5–5.2)
Alkaline Phosphatase: 32 U/L — ABNORMAL LOW (ref 39–117)
BUN: 11 mg/dL (ref 6–23)
CO2: 27 meq/L (ref 19–32)
Calcium: 9 mg/dL (ref 8.4–10.5)
Chloride: 105 meq/L (ref 96–112)
Creatinine, Ser: 0.82 mg/dL (ref 0.40–1.20)
GFR: 96.72 mL/min (ref >60.00)
Glucose, Bld: 81 mg/dL (ref 70–99)
Potassium: 3.7 meq/L (ref 3.5–5.1)
Sodium: 137 meq/L (ref 135–145)
Total Bilirubin: 0.6 mg/dL (ref 0.2–1.2)
Total Protein: 6.8 g/dL (ref 6.0–8.3)

## 2023-07-28 LAB — LIPID PANEL
Cholesterol: 147 mg/dL (ref 0–200)
HDL: 52.7 mg/dL (ref >39.00)
LDL Cholesterol: 81 mg/dL (ref 0–99)
NonHDL: 94.15
Total CHOL/HDL Ratio: 3
Triglycerides: 65 mg/dL (ref 0.0–149.0)
VLDL: 13 mg/dL (ref 0.0–40.0)

## 2023-07-28 NOTE — Patient Instructions (Addendum)
Welcome to [facility]!  I have attached some information about reflux and dietary changes that can help control this.  May continue famotidine as needed.  We are checking labs today, will be in contact with any results that require further attention  Follow-up with me as needed.

## 2023-12-30 ENCOUNTER — Encounter (HOSPITAL_BASED_OUTPATIENT_CLINIC_OR_DEPARTMENT_OTHER): Payer: Self-pay

## 2023-12-30 ENCOUNTER — Emergency Department (HOSPITAL_BASED_OUTPATIENT_CLINIC_OR_DEPARTMENT_OTHER)

## 2023-12-30 ENCOUNTER — Emergency Department (HOSPITAL_BASED_OUTPATIENT_CLINIC_OR_DEPARTMENT_OTHER)
Admission: EM | Admit: 2023-12-30 | Discharge: 2023-12-30 | Disposition: A | Discharge status: Home / Self Care | Source: Ambulatory Visit | Attending: Emergency Medicine | Admitting: Emergency Medicine

## 2023-12-30 ENCOUNTER — Ambulatory Visit: Payer: Self-pay

## 2023-12-30 ENCOUNTER — Other Ambulatory Visit: Payer: Self-pay

## 2023-12-30 DIAGNOSIS — R102 Pelvic and perineal pain: Secondary | ICD-10-CM | POA: Diagnosis present

## 2023-12-30 DIAGNOSIS — K5900 Constipation, unspecified: Secondary | ICD-10-CM | POA: Insufficient documentation

## 2023-12-30 DIAGNOSIS — N83201 Unspecified ovarian cyst, right side: Secondary | ICD-10-CM | POA: Insufficient documentation

## 2023-12-30 LAB — URINALYSIS, ROUTINE W REFLEX MICROSCOPIC
Bilirubin Urine: NEGATIVE
Glucose, UA: NEGATIVE mg/dL
Hgb urine dipstick: NEGATIVE
Ketones, ur: NEGATIVE mg/dL
Leukocytes,Ua: NEGATIVE
Nitrite: NEGATIVE
Protein, ur: NEGATIVE mg/dL
Specific Gravity, Urine: 1.023 (ref 1.005–1.030)
pH: 5.5 (ref 5.0–8.0)

## 2023-12-30 LAB — COMPREHENSIVE METABOLIC PANEL WITH GFR
ALT: 15 U/L (ref 0–44)
AST: 26 U/L (ref 15–41)
Albumin: 4.2 g/dL (ref 3.5–5.0)
Alkaline Phosphatase: 47 U/L (ref 38–126)
Anion gap: 11 (ref 5–15)
BUN: 12 mg/dL (ref 6–20)
CO2: 23 mmol/L (ref 22–32)
Calcium: 9.6 mg/dL (ref 8.9–10.3)
Chloride: 105 mmol/L (ref 98–111)
Creatinine, Ser: 0.99 mg/dL (ref 0.44–1.00)
GFR, Estimated: >60 mL/min (ref >60)
Glucose, Bld: 80 mg/dL (ref 70–99)
Potassium: 3.8 mmol/L (ref 3.5–5.1)
Sodium: 139 mmol/L (ref 135–145)
Total Bilirubin: 0.6 mg/dL (ref 0.0–1.2)
Total Protein: 7.2 g/dL (ref 6.5–8.1)

## 2023-12-30 LAB — CBC WITH DIFFERENTIAL/PLATELET
Abs Immature Granulocytes: 0.02 10*3/uL (ref 0.00–0.07)
Basophils Absolute: 0 10*3/uL (ref 0.0–0.1)
Basophils Relative: 1 %
Eosinophils Absolute: 0 10*3/uL (ref 0.0–0.5)
Eosinophils Relative: 0 %
HCT: 37.8 % (ref 36.0–46.0)
Hemoglobin: 13.1 g/dL (ref 12.0–15.0)
Immature Granulocytes: 0 %
Lymphocytes Relative: 26 %
Lymphs Abs: 1.4 10*3/uL (ref 0.7–4.0)
MCH: 29.7 pg (ref 26.0–34.0)
MCHC: 34.7 g/dL (ref 30.0–36.0)
MCV: 85.7 fL (ref 80.0–100.0)
Monocytes Absolute: 0.4 10*3/uL (ref 0.1–1.0)
Monocytes Relative: 7 %
Neutro Abs: 3.5 10*3/uL (ref 1.7–7.7)
Neutrophils Relative %: 66 %
Platelets: 314 10*3/uL (ref 150–400)
RBC: 4.41 MIL/uL (ref 3.87–5.11)
RDW: 13.7 % (ref 11.5–15.5)
WBC: 5.3 10*3/uL (ref 4.0–10.5)
nRBC: 0 % (ref 0.0–0.2)

## 2023-12-30 LAB — WET PREP, GENITAL
Clue Cells Wet Prep HPF POC: NONE SEEN
Sperm: NONE SEEN
Trich, Wet Prep: NONE SEEN
WBC, Wet Prep HPF POC: >=10 — AB (ref <10)
Yeast Wet Prep HPF POC: NONE SEEN

## 2023-12-30 LAB — PREGNANCY, URINE: Preg Test, Ur: NEGATIVE

## 2023-12-30 MED ORDER — KETOROLAC TROMETHAMINE 15 MG/ML IJ SOLN
15.0000 mg | Freq: Once | INTRAMUSCULAR | Status: DC
  Filled 2023-12-30: qty 1

## 2023-12-30 MED ORDER — KETOROLAC TROMETHAMINE 15 MG/ML IJ SOLN
15.0000 mg | Freq: Once | INTRAMUSCULAR | Status: AC
  Administered 2023-12-30: 15 mg via INTRAMUSCULAR

## 2023-12-30 MED ORDER — IOHEXOL 300 MG/ML  SOLN
100.0000 mL | Freq: Once | INTRAMUSCULAR | Status: AC | PRN
  Administered 2023-12-30: 100 mL via INTRAVENOUS

## 2023-12-30 NOTE — Discharge Instructions (Addendum)
 On your ultrasound, they see a complex right ovarian cyst. They are recommending follow up in 6-12 weeks for repeat imaging. Please call your gynecologist to have this scheduled.  In the meantime, would like for you to increase your water intake and also try a stool softener such as MiraLAX.  I would try 1 scoop daily and if you have not had any success in the next 3 to 4 days, you can increase to 2 scoops daily.  You can still take the ibuprofen as needed.  I recommend taking 1000 mg of Tylenol  and/or 600 mg of ibuprofen every 6 hours as needed for pain.  I have given you dose of ibuprofen here so do not take any additional dose until tomorrow morning.  I have included more information into the discharge paperwork for you to review.  If you have any concerns, new or worsening symptoms, please return to your nearest emergency department for reevaluation.

## 2023-12-30 NOTE — Telephone Encounter (Signed)
 FYI Only or Action Required?: FYI only for provider.  Patient was last seen in primary care on 07/28/2023 by Alvia Corean CROME, FNP. Called Nurse Triage reporting No chief complaint on file.. Symptoms began today. Interventions attempted: OTC medications: see note. Symptoms are: unchanged.  Triage Disposition: See HCP Within 4 Hours (Or PCP Triage) Referred to local ED. See note.   Patient/caregiver understands and will follow disposition?: Yes     Copied from CRM [id number]. Topic: Clinical - Red Word Triage >> Dec 30, 2023 12:35 PM Franky GRADE wrote: Red Word that prompted transfer to Nurse Triage:Patient has been experiencing extreme bloating, stomach pain, frequent urination and pelvic pain. Reason for Disposition  [1] MILD-MODERATE pain AND [2] constant AND [3] present > 2 hours  Answer Assessment - Initial Assessment Questions 1. LOCATION: Where does it hurt?      ------------- Pelvic Area     2. RADIATION: Does the pain shoot anywhere else? (e.g., lower back, groin, thighs)     ---------------- Denies    3. ONSET: When did the pain begin? (e.g., minutes, hours or days ago)      ---------------- Today    4. SUDDEN: Gradual or sudden onset?     -------------------------- Sudden   5. PATTERN Does the pain come and go, or is it constant?    - If constant: Is it getting better, staying the same, or worsening?      (Note: Constant means the pain never goes away completely; most serious pain is constant and gets worse over time)     - If intermittent: How long does it last? Do you have pain now?     (Note: Intermittent means the pain goes away completely between bouts)      ----------------------- Sharp- Constant      6. SEVERITY: How bad is the pain?  (e.g., Scale 1-10; mild, moderate, or severe)   - MILD (1-3): doesn't interfere with normal activities, area soft and not tender to touch    - MODERATE (4-7): interferes with normal activities or awakens  from sleep, abdomen tender to touch    - SEVERE (8-10): excruciating pain, doubled over, unable to do any normal activities       -----------------------4/10 during the call ( already took Ibuprofen 800mg  and currently using heating pad)  ----------------------------- Prior to those interventions, pain 6/10     7. RECURRENT SYMPTOM: Have you ever had this type of pelvic pain before? If Yes, ask: When was the last time? and What happened that time?      ------ Denies   8. CAUSE: What do you think is causing the pelvic pain?     ----- Unsure    9. RELIEVING/AGGRAVATING FACTORS: What makes it better or worse? (e.g., activity/rest, sexual intercourse, voiding, passing stool)     --- Heating pad and Ibuprofen gives some relief    10. OTHER SYMPTOMS: Has there been any other symptoms? (e.g., fever, constipation, diarrhea, urine problems, vaginal bleeding, vaginal discharge, or vomiting?       --- Urinary frequency ( was seen by ON/GYN recently): Negative culture for UTI --------- Abd bloating    11. PREGNANCY: Is there any chance you are pregnant? When was your last menstrual period?       ----- LMP: 06/21     Additional Info:  Attempted to schedule an appt within pt dispo timeframe. No appt available.  Patient referred to local ED- Nursing judgement.  Patient verbalized understanding, no additional questions/concerns noted during  the time of the call.  Protocols used: Pelvic Pain - Female-A-AH

## 2023-12-30 NOTE — ED Provider Notes (Cosign Needed Addendum)
 [place] EMERGENCY DEPARTMENT AT [facility] Provider Note   CSN: [id number] Arrival date & time: 12/30/23  1316     Patient presents with: Pelvic Pain   Karen Benitez is a 28 y.o. female reportedly otherwise healthy presents emerged part today for evaluation of pelvic pain/lower abdominal pain since yesterday.  Patient reports that she had the symptoms just in the weeks prior to seeing urgent care and her gynecologist.  She was tested for UTI however was unremarkable even with the culture.  She reports that she was doing some ab workouts for about an hour and then started noticed that she was having some lower abdominal/pelvic pain.  Denies any blunt trauma.  She reports that occasionally she will have some sharp pain but is not having sharp pain today and was just yesterday.  Reports that today feels more dull.  She was prescribed an antibiotic for UTI however did not take them.  She reports that she has bowel movements daily.  She has some urgency frequency however this is chronic for her.  She denies any vaginal bleeding, vaginal discharge, dysuria, hematuria, nausea, vomiting, melena hematochezia.  She did some ibuprofen around 0600 without much relief.  She is still passing gas.  Marijuana use pain    Pelvic Pain Associated symptoms include abdominal pain. Pertinent negatives include no chest pain and no shortness of breath.       Prior to Admission medications   Medication Sig Start Date End Date Taking? Authorizing Provider  albuterol  (PROVENTIL  HFA;VENTOLIN  HFA) 108 (90 Base) MCG/ACT inhaler Inhale 1-2 puffs into the lungs every 4 (four) hours as needed for wheezing or shortness of breath. Patient not taking: Reported on 11/09/2017 09/08/15 09/04/20  Patel-Mills, Hanna, PA-C    Allergies: Patient has no known allergies.    Review of Systems  Constitutional:  Negative for chills and fever.  Respiratory:  Negative for shortness of breath.   Cardiovascular:  Negative for  chest pain.  Gastrointestinal:  Positive for abdominal pain and constipation. Negative for abdominal distention, diarrhea, nausea and vomiting.       Denies any fecal incontinence  Genitourinary:  Positive for dysuria, frequency, pelvic pain and urgency. Negative for hematuria, vaginal bleeding and vaginal discharge.       Denies any urinary incontinence or urinary retention.  Musculoskeletal:  Positive for back pain.       Denies any saddle anesthesia  Neurological:  Negative for weakness and numbness.    Updated Vital Signs BP (!) 119/94 (BP Location: Left Arm)   Pulse 87   Temp 98.7 F (37.1 C)   Resp 18   SpO2 99%   Physical Exam Vitals and nursing note reviewed. Exam conducted with a chaperone present Franky, Charity fundraiser).  Constitutional:      General: She is not in acute distress.    Appearance: She is not ill-appearing or toxic-appearing.     Comments: On phone, lying on stretcher in no acute distress  HENT:     Mouth/Throat:     Mouth: Mucous membranes are moist.  Eyes:     General: No scleral icterus. Cardiovascular:     Rate and Rhythm: Normal rate.  Pulmonary:     Effort: Pulmonary effort is normal. No respiratory distress.  Abdominal:     General: Bowel sounds are normal. There is no distension.     Palpations: Abdomen is soft.     Tenderness: There is abdominal tenderness. There is no right CVA tenderness, left CVA tenderness, guarding  or rebound.     Comments: Minimal abdominal tenderness palpation to the very low abdomen.  Soft.  No guarding rebound.  Genitourinary:    Pubic Area: No rash.      Labia:        Right: No rash, tenderness or lesion.        Left: No rash, tenderness or lesion.      Urethra: No prolapse, urethral swelling or urethral lesion.     Cervix: No cervical motion tenderness.     Comments: Small amount of mucosa present in the vagina with small amount of thin white discharge.  No CMT.  No erythema or cervical friability.  No  lesion. Musculoskeletal:        General: No tenderness.     Right lower leg: No edema.     Left lower leg: No edema.     Comments: No tenderness to the midline or paraspinal back.  Strength intact.  Gait normal.  Skin:    General: Skin is warm and dry.  Neurological:     General: No focal deficit present.     Mental Status: She is alert.     Motor: No weakness.     Gait: Gait normal.     (all labs ordered are listed, but only abnormal results are displayed) Labs Reviewed  URINALYSIS, ROUTINE W REFLEX MICROSCOPIC  PREGNANCY, URINE    EKG: None  Radiology: US  PELVIC COMPLETE W TRANSVAGINAL AND TORSION R/O Result Date: 12/30/2023 CLINICAL DATA:  Pelvic pain. EXAM: TRANSABDOMINAL AND TRANSVAGINAL ULTRASOUND OF PELVIS DOPPLER ULTRASOUND OF OVARIES TECHNIQUE: Both transabdominal and transvaginal ultrasound examinations of the pelvis were performed. Transabdominal technique was performed for global imaging of the pelvis including uterus, ovaries, adnexal regions, and pelvic cul-de-sac. It was necessary to proceed with endovaginal exam following the transabdominal exam to visualize the uterus, endometrium, bilateral ovaries and bilateral adnexa. Color and duplex Doppler ultrasound was utilized to evaluate blood flow to the ovaries. COMPARISON:  None Available. FINDINGS: Uterus Measurements: 8.1 cm x 5.2 cm x 4.3 cm = volume: 94.3 mL. No fibroids or other mass visualized. Endometrium Thickness: 12.0 mm.  No focal abnormality visualized. Right ovary Measurements: 4.0 cm x 2.2 cm x 2.9 cm = volume: 13.5 mL. A 2.7 cm x 2.0 cm x 2.2 cm complex right ovarian cyst is seen. Left ovary Measurements: 3.0 cm x 2.1 cm x 2.5 cm = volume: 8.2 mL. Normal appearance/no adnexal mass. Pulsed Doppler evaluation of both ovaries demonstrates normal low-resistance arterial and venous waveforms. Other findings A trace amount of pelvic free fluid is noted. IMPRESSION: Complex right ovarian cyst. Correlation with 6-12 week  follow-up pelvic ultrasound is recommended to determine stability. Electronically Signed   By: Suzen Dials M.D.   On: 12/30/2023 19:46   CT ABDOMEN PELVIS W CONTRAST Result Date: 12/30/2023 CLINICAL DATA:  Left lower quadrant and right lower quadrant pain. Pelvic pain. Right-sided low back pain. EXAM: CT ABDOMEN AND PELVIS WITH CONTRAST TECHNIQUE: Multidetector CT imaging of the abdomen and pelvis was performed using the standard protocol following bolus administration of intravenous contrast. RADIATION DOSE REDUCTION: This exam was performed according to the departmental dose-optimization program which includes automated exposure control, adjustment of the mA and/or kV according to patient size and/or use of iterative reconstruction technique. CONTRAST:  OMNIPAQUE  IOHEXOL  300 MG/ML  SOLN COMPARISON:  None Available. FINDINGS: Lower chest: Lung bases are clear. Heart size normal. No pericardial or pleural effusion. Distal esophagus is grossly unremarkable. Hepatobiliary: Focal  fat along the falciform ligament. Liver and gallbladder are otherwise unremarkable. No biliary ductal dilatation. Pancreas: Negative. Spleen: Negative. Adrenals/Urinary Tract: Adrenal glands and kidneys are unremarkable. Ureters are decompressed. Bladder is grossly unremarkable. Stomach/Bowel: Stomach, small bowel and appendix are unremarkable. Moderate stool burden. Colon is otherwise unremarkable. Vascular/Lymphatic: Left renal vein may be circumaortic. Vascular structures are otherwise unremarkable. No pathologically enlarged lymph nodes. Reproductive: Uterus is visualized. 2.3 cm mildly heterogeneous low-attenuation structure above the uterus is likely within the right ovary. Other: Trace pelvic free fluid. Mesenteries and peritoneum are otherwise unremarkable. Musculoskeletal: None. IMPRESSION: 1. Small heterogeneous hyperattenuating lesion in the right ovary may represent a hemorrhagic cyst. 2. Otherwise, no acute findings.  3. Moderate stool burden. Electronically Signed   By: Newell Eke M.D.   On: 12/30/2023 17:52   CT L-SPINE NO CHARGE Result Date: 12/30/2023 CLINICAL DATA:  Back pain after working out. EXAM: CT LUMBAR SPINE WITHOUT CONTRAST TECHNIQUE: Multidetector CT imaging of the lumbar spine was performed without intravenous contrast administration. Multiplanar CT image reconstructions were also generated. RADIATION DOSE REDUCTION: This exam was performed according to the departmental dose-optimization program which includes automated exposure control, adjustment of the mA and/or kV according to patient size and/or use of iterative reconstruction technique. COMPARISON:  CT abdomen pelvis dated 12/30/2023. FINDINGS: Segmentation: 5 lumbar type vertebrae. Alignment: Normal. Vertebrae: No acute fracture or focal pathologic process. Paraspinal and other soft tissues: Negative. Disc levels: No acute findings.  No degenerative changes. IMPRESSION: No acute/traumatic lumbar spine pathology. Electronically Signed   By: Vanetta Chou M.D.   On: 12/30/2023 17:49     Procedures   Medications Ordered in the ED - No data to display   Medical Decision Making Amount and/or Complexity of Data Reviewed Labs: ordered. Radiology: ordered.  Risk Prescription drug management.   28 y.o. female presents to the ER for evaluation of lower abdominal tenderness. Differential diagnosis includes but is not limited to AAA, mesenteric ischemia, appendicitis, diverticulitis, DKA, gastroenteritis, nephrolithiasis, pancreatitis, constipation, UTI, bowel obstruction, biliary disease, IBD, PUD, hepatitis, ectopic pregnancy, ovarian torsion, PID. Vital signs BP 119/94, otherwise unremarkable . Physical exam as noted above.   Patient is not appear in any acute distress.  Abdomen is tender to the lower belly.  She has been seen for this previously by her gynecologist just a few days prior.  Had an unremarkable urinalysis and urine  culture.  Will start off first with CT scan and labs, plus or minus ordering ultrasound later.  I independently reviewed and interpreted the patient's labs.  CBC unremarkable.  CMP unremarkable.  Urinalysis unremarkable.  Pregnancy negative wet prep shows greater than 10 white blood cells, nonspecific.  GC pending.  CT scan shows 1. Small heterogeneous hyperattenuating lesion in the right ovary may represent a hemorrhagic cyst. 2. Otherwise, no acute findings. 3. Moderate stool burden. Per radiologist's interpretation.    US  shows Complex right ovarian cyst. Correlation with 6-12 week follow-up pelvic ultrasound is recommended to determine stability. Per radiologist's interpretation.    Pelvic examination unremarkable, doubt any PID.  She may be having some bladder spasms or interstitial cystitis given that she reports she has chronic urgency and frequency.  She also may be experiencing pain or the symptoms from her stool burden constipation.  CT does not show any appendicitis.  We discussed staying well-hydrated taking bowel regimen such as with MiraLAX.  She does have a small cyst however no torsion.  Overall I do not find any acute emergent workup or  need for admission.  I have recommended that she follow with a gynecologist which she already has close follow-up with for repeat imaging for her cyst.  Also we discussed MiraLAX and bowel regimens.  She is likely having some referred back pain from her constipation or cyst.  She does not have any back red flag symptoms, doubt any cauda equina or any epidural abscess.  We discussed return precautions.  Stable for discharge.  We discussed the results of the labs/imaging. The plan is follow up with gynecology, Miralax and bowel regimen . We discussed strict return precautions and red flag symptoms. The patient verbalized their understanding and agrees to the plan. The patient is stable and being discharged home in good condition.  Portions of this report  may have been transcribed using voice recognition software. Every effort was made to ensure accuracy; however, inadvertent computerized transcription errors may be present.    Final diagnoses:  Right ovarian cyst  Constipation, unspecified constipation type    ED Discharge Orders     None          Bernis Ernst, PA-C 01/03/24 2132    Bernis Ernst, PA-C 01/03/24 2137    Tegeler, Lonni PARAS, MD 01/04/24 [phone]

## 2023-12-30 NOTE — ED Notes (Signed)
 Patient transported to CT

## 2023-12-30 NOTE — ED Triage Notes (Signed)
 C/o pelvic pain since yesterday around 1700 after working out. Denies bleeding. Endorses R sided lower back pain.

## 2023-12-31 ENCOUNTER — Telehealth: Admitting: Family Medicine

## 2023-12-31 LAB — GC/CHLAMYDIA PROBE AMP (~~LOC~~) NOT AT ARMC
Chlamydia: NEGATIVE
Comment: NEGATIVE
Comment: NORMAL
Neisseria Gonorrhea: NEGATIVE

## 2024-01-01 LAB — URINE CULTURE: Culture: 30000 — AB

## 2024-01-02 ENCOUNTER — Telehealth (HOSPITAL_BASED_OUTPATIENT_CLINIC_OR_DEPARTMENT_OTHER): Payer: Self-pay | Admitting: *Deleted

## 2024-01-02 NOTE — Progress Notes (Signed)
 Subjective:    Patient ID: Karen Benitez is a 28 y.o. female.  Chief Complaint:  28 yo FEMALE PT HERE FOR COTINUED DYSURIA ON URINATION.   SHE WAS HAVING PELVIC PAIN AND WENT TO THE [facility], AND GOUND TO HAVE COMPLEX RIGHT SIDED OVARIAN CYST ON U/S, ALSO HAVING CT SCAN OF ABD/PELVIS AND WORK UP.   [ THAT CHART REVIEWED].   A URINE CX WAS DONE AND OBTAINED AFTER HER RELEASE, SHOWING PROTEUS MIRABILIS, HOWEVER NO MEDICATIONS WERE GIVEN, ACCORDING TO THE CHART.   SHE ENDORSES JUST GETTING CULTURE RESULTS BACK THIS AM, AN IS WHY SHE IS HERE, TO GET TREATMENT.    Urinary Tract Infection  This is a new problem. The current episode started 1 to 4 weeks ago. The problem occurs every urination. The problem has been unchanged. The quality of the pain is described as burning. The pain is moderate. There has been no fever. Pertinent negatives include no chills, discharge, flank pain, frequency, hesitancy or nausea. She has tried nothing for the symptoms. The treatment provided no relief.    No past medical history on file.  No past surgical history on file.  No family history on file.  Social History   Tobacco Use  . Smoking status: Unknown    Review of Systems  Constitutional:  Negative for chills.  HENT: Negative.    Eyes: Negative.   Respiratory: Negative.    Cardiovascular: Negative.   Gastrointestinal: Negative.  Negative for nausea.  Genitourinary:  Positive for dysuria. Negative for flank pain, frequency and hesitancy.  Musculoskeletal: Negative.   Skin: Negative.   Neurological: Negative.   Psychiatric/Behavioral: Negative.      Objective:   BP (!) 138/92 (BP Location: Right Upper Arm, Patient Position: Sitting)   Pulse 73   Temp 98.3 F (36.8 C) (Oral)   Resp 18   LMP 12/23/2023 (Exact Date)   SpO2 99%   Physical Exam Vitals and nursing note reviewed.  Constitutional:      General: She is not in acute distress.    Appearance: Normal appearance.  HENT:      Head: Normocephalic and atraumatic.     Right Ear: External ear normal.     Left Ear: External ear normal.     Nose: Nose normal.     Mouth/Throat:     Mouth: Mucous membranes are moist.  Eyes:     Extraocular Movements: Extraocular movements intact.     Conjunctiva/sclera: Conjunctivae normal.  Cardiovascular:     Rate and Rhythm: Normal rate and regular rhythm.     Pulses: Normal pulses.  Musculoskeletal:        General: Normal range of motion.     Cervical back: Normal range of motion and neck supple.  Pulmonary:     Effort: Pulmonary effort is normal. No respiratory distress.  Abdominal:     Tenderness: There is no right CVA tenderness or left CVA tenderness.  Skin:    General: Skin is warm and dry.  Neurological:     General: No focal deficit present.     Mental Status: She is alert and oriented to person, place, and time.     Cranial Nerves: No cranial nerve deficit.  Psychiatric:        Mood and Affect: Mood normal.        Behavior: Behavior normal.     Assessment:   The encounter diagnosis was Dysuria.  Plan:   -   PT WILL PICK UP RX  AND TAKE AS DIRECTED PENDING RETURN OF OUR CX.  -  HOWEVER IT IS CURIUS PT STATES SHE HAD 2 NEGATIVE CULTURES IMMEDIATELY PRIOR TO HER ED VISIT.

## 2024-01-02 NOTE — Telephone Encounter (Signed)
 Post ED Visit - Positive Culture Follow-up  Culture report reviewed by antimicrobial stewardship pharmacist: [facility] []  Rankin Dee, Pharm.D. []  Venetia Gully, [address].D., BCPS AQ-ID []  Garrel Crews, Pharm.D., BCPS []  Almarie Lunger, Pharm.D., BCPS []  [place], [address].D., BCPS, AAHIVP []  Rosaline Bihari, Pharm.D., BCPS, AAHIVP []  Vernell Meier, PharmD, BCPS []  Latanya Hint, PharmD, BCPS []  Donald Medley, PharmD, BCPS []  Rocky Bold, PharmD []  Dorothyann Alert, PharmD, BCPS [x]  Dorn Buttner, PharmD  [facility] []  Rosaline Edison, PharmD []  Romona Bliss, PharmD []  Dolphus Roller, PharmD []  Veva Seip, Rph []  Vernell Daunt) Leonce, PharmD []  Eva Allis, PharmD []  Rosaline Millet, PharmD []  Iantha Batch, PharmD []  Arvin Gauss, PharmD []  Wanda Hasting, PharmD []  Ronal Rav, PharmD []  Rocky Slade, PharmD []  Bard Jeans, PharmD   Positive urine culture Pt returned call. Stated [facility] prescribed her Bactrim. No changes needed per Dr. Bernardino Fireman, MD  Albino Alan Novak 01/02/2024, 3:23 PM

## 2024-01-02 NOTE — Telephone Encounter (Signed)
 Post ED Visit - Positive Culture Follow-up: Unsuccessful Patient Follow-up  Culture assessed and recommendations reviewed by:  [x]  Dorn Buttner, Pharm.D. []  Venetia Gully, Pharm.D., BCPS AQ-ID []  Garrel Crews, Pharm.D., BCPS []  Almarie Lunger, [address].D., BCPS []  [place], [address].D., BCPS, AAHIVP []  Rosaline Bihari, Pharm.D., BCPS, AAHIVP []  Massie Rigg, PharmD []  Jodie Rower, PharmD, BCPS  Positive urine culture  [x]  Patient discharged without antimicrobial prescription and treatment is now indicated []  Organism is resistant to prescribed ED discharge antimicrobial []  Patient with positive blood cultures  Recommend Keflex 500mg  BID x 7 days per Bernardino Fireman, MD  Plan:  Pt seen at [facility] and [facility] note states prescribing something but can't see which antibiotic prescribed.     Call pt to see what was prescribed.  If Cephalasporin, Amp, Amoxicillin, Cipro ,Levofloxacin, or Bactrim no changes needed. Call pharmacist if anything else is given.  [phone]   Unable to contact patient , letter will be sent to address on file  Albino Alan Novak 01/02/2024, 2:22 PM

## 2024-01-02 NOTE — Progress Notes (Signed)
 ED Antimicrobial Stewardship Positive Culture Follow Up   Karen Benitez is an 28 y.o. female who presented to [facility] on @[facility]MIT[facility] @ with a chief complaint of  Chief Complaint  Patient presents with   Pelvic Pain    Recent Results (from the past 720 hours)  Urine Culture     Status: Abnormal   Collection Time: 12/30/23  2:47 PM   Specimen: Urine, Clean Catch  Result Value Ref Range Status   Specimen Description   Final    URINE, CLEAN CATCH Performed at [facility], [address], [place], [place] [address]    Special Requests   Final    NONE Performed at [facility], [address] [place], [place] [address]    Culture 30,000 COLONIES/mL PROTEUS MIRABILIS (A)  Final   Report Status 01/01/2024 FINAL  Final   Organism ID, Bacteria PROTEUS MIRABILIS (A)  Final      Susceptibility   Proteus mirabilis - MIC*    AMPICILLIN <=2 SENSITIVE Sensitive     CEFAZOLIN <=4 SENSITIVE Sensitive     CEFEPIME <=0.12 SENSITIVE Sensitive     CEFTRIAXONE <=0.25 SENSITIVE Sensitive     CIPROFLOXACIN <=0.25 SENSITIVE Sensitive     GENTAMICIN <=1 SENSITIVE Sensitive     IMIPENEM 2 SENSITIVE Sensitive     NITROFURANTOIN 128 RESISTANT Resistant     TRIMETH/SULFA <=20 SENSITIVE Sensitive     AMPICILLIN/SULBACTAM <=2 SENSITIVE Sensitive     PIP/TAZO <=4 SENSITIVE Sensitive ug/mL    * 30,000 COLONIES/mL PROTEUS MIRABILIS  Wet prep, genital     Status: Abnormal   Collection Time: 12/30/23  3:35 PM   Specimen: Cervix  Result Value Ref Range Status   Yeast Wet Prep HPF POC NONE SEEN NONE SEEN Final   Trich, Wet Prep NONE SEEN NONE SEEN Final   Clue Cells Wet Prep HPF POC NONE SEEN NONE SEEN Final   WBC, Wet Prep HPF POC >=10 (A) <10 Final    Comment: Swab received with less than 0.5 mL of saline, saline added to specimen, interpret results with caution.   Sperm NONE SEEN  Final    Comment: Performed at [facility], [address] [place], [place] [address]    [x]  Patient seen at [facility] after visiting our ED for the culture above after leaving our ED. Pt is symptomatic for cystitis. [facility]'s note discusses prescribing something, but I do not see what they prescribed and her visit there was prior to our sensitivities resulting. I tried to call patient to see if anything was prescribed but she did not answer.  Recommend: Keflex 500 mg BID x 7 days (Qty 14; Refills 0) If [facility] did send in prescription, no change needed for a cephalosporin, amoxicillin (or ampicillin or augmentin), ciprofloxacin, levofloxacin, or Bactrim. If anything else is prescribed call me [phone]), to see if change is needed. Thanks!  ED Provider: Melvenia Dorn Buttner, PharmD, BCPS 01/02/2024 12:57 PM ED Clinical Pharmacist -  [phone]

## 2024-04-01 ENCOUNTER — Encounter: Payer: Self-pay | Admitting: Family Medicine

## 2024-04-01 ENCOUNTER — Encounter: Payer: Self-pay | Admitting: Family

## 2024-04-01 ENCOUNTER — Ambulatory Visit: Admitting: Family

## 2024-04-01 ENCOUNTER — Ambulatory Visit: Payer: Self-pay | Admitting: *Deleted

## 2024-04-01 VITALS — BP 108/70 | Temp 98.2°F | Ht 65.0 in | Wt 157.0 lb

## 2024-04-01 DIAGNOSIS — R5383 Other fatigue: Secondary | ICD-10-CM

## 2024-04-01 DIAGNOSIS — K2100 Gastro-esophageal reflux disease with esophagitis, without bleeding: Secondary | ICD-10-CM

## 2024-04-01 LAB — CBC WITH DIFFERENTIAL/PLATELET
Basophils Absolute: 0 K/uL (ref 0.0–0.1)
Basophils Relative: 0.9 % (ref 0.0–3.0)
Eosinophils Absolute: 0 K/uL (ref 0.0–0.7)
Eosinophils Relative: 1.2 % (ref 0.0–5.0)
HCT: 40.8 % (ref 36.0–46.0)
Hemoglobin: 13.8 g/dL (ref 12.0–15.0)
Lymphocytes Relative: 28.3 % (ref 12.0–46.0)
Lymphs Abs: 1 K/uL (ref 0.7–4.0)
MCHC: 33.8 g/dL (ref 30.0–36.0)
MCV: 87.6 fl (ref 78.0–100.0)
Monocytes Absolute: 0.3 K/uL (ref 0.1–1.0)
Monocytes Relative: 8.5 % (ref 3.0–12.0)
Neutro Abs: 2.2 K/uL (ref 1.4–7.7)
Neutrophils Relative %: 61.1 % (ref 43.0–77.0)
Platelets: 282 K/uL (ref 150.0–400.0)
RBC: 4.66 Mil/uL (ref 3.87–5.11)
RDW: 13.8 % (ref 11.5–15.5)
WBC: 3.6 K/uL — ABNORMAL LOW (ref 4.0–10.5)

## 2024-04-01 MED ORDER — OMEPRAZOLE 40 MG PO CPDR: 40.0000 mg | DELAYED_RELEASE_CAPSULE | Freq: Every day | ORAL | 3 refills | Status: AC

## 2024-04-01 NOTE — Progress Notes (Signed)
 Acute Office Visit  Subjective:     Patient ID: Karen Benitez, female    DOB: 1996/03/29, 28 y.o.   MRN: [id number]  Chief Complaint  Patient presents with  . Abdominal Pain    Says it been over her stomach cramping  and moved to her chest felt like pressure  last night , has been tired a lot lately     HPI Patient is in today with complaints of epigastric pain and pain in her upper back over the last 2 to 3 days.  The pain typically is worse at night.  Has taken Gas-X that has helped.  Feels bloated but unable to burp.  Reports celebrating her birthday recently.  That included cocktails, shots of alcohol, oysters with hot sauce and horseradish, smoking.  Reports overall that her diet just has not been good since her birthday.  Patient also has had some issues with fatigue and her mother wants her to have an iron profile.  Review of Systems  Gastrointestinal:  Positive for constipation, heartburn and nausea.       Epigastric pain  Psychiatric/Behavioral: Negative.    All other systems reviewed and are negative.   Past Medical History:  Diagnosis Date  . Chest pain     Social History   Socioeconomic History  . Marital status: Significant Other    Spouse name: Not on file  . Number of children: Not on file  . Years of education: Not on file  . Highest education level: Bachelor's degree (e.g., BA, AB, BS)  Occupational History  . Not on file  Tobacco Use  . Smoking status: Some Days    Types: Cigars  . Smokeless tobacco: Never  Vaping Use  . Vaping status: Some Days  Substance and Sexual Activity  . Alcohol use: Yes    Alcohol/week: 1.0 standard drink of alcohol    Types: 1 Cans of beer per week    Comment: socially on weekends  . Drug use: Yes    Frequency: 2.0 times per week    Types: Marijuana  . Sexual activity: Yes  Other Topics Concern  . Not on file  Social History Narrative  . Not on file   Social Drivers of Health   Financial Resource Strain: Low  Risk  (07/27/2023)   Overall Financial Resource Strain (CARDIA)   . Difficulty of Paying Living Expenses: Not very hard  Food Insecurity: No Food Insecurity (07/27/2023)   Hunger Vital Sign   . Worried About Programme researcher, broadcasting/film/video in the Last Year: Never true   . Ran Out of Food in the Last Year: Never true  Transportation Needs: No Transportation Needs (07/27/2023)   PRAPARE - Transportation   . Lack of Transportation (Medical): No   . Lack of Transportation (Non-Medical): No  Physical Activity: Sufficiently Active (07/27/2023)   Exercise Vital Sign   . Days of Exercise per Week: 4 days   . Minutes of Exercise per Session: 60 min  Stress: No Stress Concern Present (07/27/2023)   [employer] of Occupational Health - Occupational Stress Questionnaire   . Feeling of Stress : Only a little  Social Connections: Moderately Isolated (07/27/2023)   Social Connection and Isolation Panel   . Frequency of Communication with Friends and Family: Three times a week   . Frequency of Social Gatherings with Friends and Family: Patient declined   . Attends Religious Services: Never   . Active Member of Clubs or Organizations: No   . Attends  Club or Organization Meetings: Not on file   . Marital Status: Living with partner  Intimate Partner Violence: Not on file    Past Surgical History:  Procedure Laterality Date  . NO PAST SURGERIES      History reviewed. No pertinent family history.  No Known Allergies  Current Outpatient Medications on File Prior to Visit  Medication Sig Dispense Refill  . [DISCONTINUED] albuterol  (PROVENTIL  HFA;VENTOLIN  HFA) 108 (90 Base) MCG/ACT inhaler Inhale 1-2 puffs into the lungs every 4 (four) hours as needed for wheezing or shortness of breath. (Patient not taking: Reported on 11/09/2017) 1 Inhaler 0   No current facility-administered medications on file prior to visit.    BP 108/70 (BP Location: Left Arm, Patient Position: Sitting, Cuff Size: Normal)   Temp 98.2  F (36.8 C) (Oral)   Ht 5' 5 (1.651 m)   Wt 157 lb (71.2 kg)   LMP 03/22/2024 (Exact Date)   BMI 26.13 kg/m chart     Objective:    BP 108/70 (BP Location: Left Arm, Patient Position: Sitting, Cuff Size: Normal)   Temp 98.2 F (36.8 C) (Oral)   Ht 5' 5 (1.651 m)   Wt 157 lb (71.2 kg)   LMP 03/22/2024 (Exact Date)   BMI 26.13 kg/m    Physical Exam Constitutional:      Appearance: She is well-developed and normal weight.  Cardiovascular:     Rate and Rhythm: Normal rate and regular rhythm.  Pulmonary:     Effort: Pulmonary effort is normal.     Breath sounds: Normal breath sounds.  Abdominal:     General: Abdomen is flat and scaphoid. Bowel sounds are normal.     Palpations: Abdomen is rigid.     Tenderness: There is no guarding or rebound.  Skin:    General: Skin is warm and dry.  Neurological:     Mental Status: She is alert.    No results found for any visits on 04/01/24.      Assessment & Plan:   Problem List Items Addressed This Visit   None Visit Diagnoses       Gastroesophageal reflux disease with esophagitis without hemorrhage    -  Primary     Other fatigue       Relevant Orders   CBC w/Diff   Iron, TIBC and Ferritin Panel       Meds ordered this encounter  Medications  . omeprazole (PRILOSEC) 40 MG capsule    Sig: Take 1 capsule (40 mg total) by mouth daily.    Dispense:  30 capsule    Refill:  3   Call the office if symptoms worsen or persist.  Recheck as scheduled and sooner as needed. No follow-ups on file.  Ladarian Bonczek B Betul Brisky, FNP

## 2024-04-01 NOTE — Telephone Encounter (Signed)
 FYI Only or Action Required?: FYI only for provider.  Patient was last seen in primary care on 07/28/2023 by Alvia Corean CROME, FNP.  Called Nurse Triage reporting Abdominal Pain.  Symptoms began several days ago.  Interventions attempted: OTC medications: tums.  Symptoms are: unchanged.  Triage Disposition: See Physician Within 24 Hours  Patient/caregiver understands and will follow disposition?: yes   Reason for Disposition  [1] MODERATE pain (e.g., interferes with normal activities) AND [2] comes and goes (cramps) AND [3] present > 24 hours  (Exception: Pain with Vomiting or Diarrhea - see that Guideline.)  Answer Assessment - Initial Assessment Questions 1. LOCATION: Where does it hurt?      Upper abdomen  2. RADIATION: Does the pain shoot anywhere else? (e.g., chest, back)     Feels crampy, upset stomach, last night moved into the chest 3. ONSET: When did the pain begin? (e.g., minutes, hours or days ago)      3 days 4. SUDDEN: Gradual or sudden onset?     sudden 5. PATTERN Does the pain come and go, or is it constant?     Off/on 6. SEVERITY: How bad is the pain?  (e.g., Scale 1-10; mild, moderate, or severe)     3/10 7. RECURRENT SYMPTOM: Have you ever had this type of stomach pain before? If Yes, ask: When was the last time? and What happened that time?      No- not this consistent 8. AGGRAVATING FACTORS: Does anything seem to cause this pain? (e.g., foods, stress, alcohol)     eating 9. CARDIAC SYMPTOMS: Do you have any of the following symptoms: chest pain, difficulty breathing, sweating, nausea?     no 10. OTHER SYMPTOMS: Do you have any other symptoms? (e.g., back pain, diarrhea, fever, urination pain, vomiting)       no  Protocols used: Abdominal Pain - Upper-A-AH  Copied from CRM #[id number]. Topic: Clinical - Red Word Triage >> Apr 01, 2024  8:36 AM Suzen RAMAN wrote: Red Word that prompted transfer to Nurse Triage: severe stomach pain  that move up to chest. Requesting an appt today

## 2024-04-02 LAB — IRON,TIBC AND FERRITIN PANEL
%SAT: 17 % (ref 16–45)
Ferritin: 122 ng/mL (ref 16–154)
Iron: 52 ug/dL (ref 40–190)
TIBC: 307 ug/dL (ref 250–450)

## 2024-04-03 ENCOUNTER — Ambulatory Visit: Payer: Self-pay | Admitting: Family
